# Patient Record
Sex: Male | Born: 1955 | Race: White | Hispanic: No | Marital: Married | Smoking: Never smoker
Health system: Southern US, Community
[De-identification: ages and names within clinical notes are randomized; demographics above are authoritative.]

## PROBLEM LIST (undated history)

## (undated) DIAGNOSIS — I10 Essential (primary) hypertension: Secondary | ICD-10-CM

## (undated) DIAGNOSIS — N200 Calculus of kidney: Secondary | ICD-10-CM

## (undated) DIAGNOSIS — N289 Disorder of kidney and ureter, unspecified: Secondary | ICD-10-CM

## (undated) HISTORY — DX: Calculus of kidney: N20.0

---

## 2002-10-10 ENCOUNTER — Emergency Department (HOSPITAL_COMMUNITY): Admission: EM | Admit: 2002-10-10 | Discharge: 2002-10-10 | Payer: Self-pay | Admitting: Emergency Medicine

## 2005-03-08 ENCOUNTER — Emergency Department (HOSPITAL_COMMUNITY): Admission: EM | Admit: 2005-03-08 | Discharge: 2005-03-08 | Payer: Self-pay | Admitting: Emergency Medicine

## 2005-05-02 ENCOUNTER — Emergency Department (HOSPITAL_COMMUNITY): Admission: EM | Admit: 2005-05-02 | Discharge: 2005-05-02 | Payer: Self-pay | Admitting: Emergency Medicine

## 2005-05-03 ENCOUNTER — Inpatient Hospital Stay (HOSPITAL_COMMUNITY): Admission: EM | Admit: 2005-05-03 | Discharge: 2005-05-06 | Payer: Self-pay | Admitting: Emergency Medicine

## 2005-05-20 ENCOUNTER — Ambulatory Visit (HOSPITAL_COMMUNITY): Admission: RE | Admit: 2005-05-20 | Discharge: 2005-05-20 | Payer: Self-pay | Admitting: Urology

## 2006-06-23 ENCOUNTER — Inpatient Hospital Stay (HOSPITAL_COMMUNITY): Admission: EM | Admit: 2006-06-23 | Discharge: 2006-06-24 | Payer: Self-pay | Admitting: Emergency Medicine

## 2006-07-01 ENCOUNTER — Inpatient Hospital Stay (HOSPITAL_COMMUNITY): Admission: EM | Admit: 2006-07-01 | Discharge: 2006-07-05 | Payer: Self-pay | Admitting: Emergency Medicine

## 2006-07-03 ENCOUNTER — Ambulatory Visit: Payer: Self-pay | Admitting: Internal Medicine

## 2009-04-13 ENCOUNTER — Inpatient Hospital Stay (HOSPITAL_COMMUNITY): Admission: EM | Admit: 2009-04-13 | Discharge: 2009-04-15 | Payer: Self-pay | Admitting: Emergency Medicine

## 2010-05-27 LAB — CBC
HCT: 44.7 % (ref 39.0–52.0)
HCT: 49.5 % (ref 39.0–52.0)
Hemoglobin: 16.6 g/dL (ref 13.0–17.0)
MCHC: 33.5 g/dL (ref 30.0–36.0)
MCHC: 33.6 g/dL (ref 30.0–36.0)
Platelets: 144 10*3/uL — ABNORMAL LOW (ref 150–400)
RDW: 13.5 % (ref 11.5–15.5)
RDW: 13.5 % (ref 11.5–15.5)
WBC: 3.9 10*3/uL — ABNORMAL LOW (ref 4.0–10.5)

## 2010-05-27 LAB — COMPREHENSIVE METABOLIC PANEL
ALT: 72 U/L — ABNORMAL HIGH (ref 0–53)
AST: 73 U/L — ABNORMAL HIGH (ref 0–37)
Alkaline Phosphatase: 42 U/L (ref 39–117)
BUN: 10 mg/dL (ref 6–23)
Calcium: 8 mg/dL — ABNORMAL LOW (ref 8.4–10.5)
Creatinine, Ser: 1.15 mg/dL (ref 0.4–1.5)
Creatinine, Ser: 1.4 mg/dL (ref 0.4–1.5)
GFR calc Af Amer: >60 mL/min (ref >60)
Glucose, Bld: 101 mg/dL — ABNORMAL HIGH (ref 70–99)
Glucose, Bld: 86 mg/dL (ref 70–99)
Sodium: 132 mEq/L — ABNORMAL LOW (ref 135–145)
Sodium: 137 mEq/L (ref 135–145)
Total Protein: 6.2 g/dL (ref 6.0–8.3)
Total Protein: 7.8 g/dL (ref 6.0–8.3)

## 2010-05-27 LAB — BLOOD GAS, ARTERIAL
Patient temperature: 37
TCO2: 21.5 mmol/L (ref 0–100)
pH, Arterial: 7.364 (ref 7.350–7.450)

## 2010-05-27 LAB — DIFFERENTIAL
Basophils Absolute: 0 10*3/uL (ref 0.0–0.1)
Basophils Relative: 0 % (ref 0–1)
Eosinophils Absolute: 0 10*3/uL (ref 0.0–0.7)
Lymphocytes Relative: 36 % (ref 12–46)
Lymphs Abs: 1 10*3/uL (ref 0.7–4.0)
Monocytes Absolute: 0.6 10*3/uL (ref 0.1–1.0)
Monocytes Relative: 14 % — ABNORMAL HIGH (ref 3–12)
Monocytes Relative: 15 % — ABNORMAL HIGH (ref 3–12)
Neutro Abs: 1.3 10*3/uL — ABNORMAL LOW (ref 1.7–7.7)
Neutro Abs: 2.9 10*3/uL (ref 1.7–7.7)
Neutrophils Relative %: 49 % (ref 43–77)

## 2010-05-27 LAB — CULTURE, BLOOD (ROUTINE X 2)
Culture: NO GROWTH
Report Status: 2112011

## 2010-05-27 LAB — URINALYSIS, ROUTINE W REFLEX MICROSCOPIC: Ketones, ur: 15 mg/dL — AB

## 2010-05-27 LAB — URINE CULTURE

## 2010-05-27 LAB — LACTIC ACID, PLASMA: Lactic Acid, Venous: 1.5 mmol/L (ref 0.5–2.2)

## 2010-07-18 ENCOUNTER — Emergency Department (HOSPITAL_COMMUNITY)
Admission: EM | Admit: 2010-07-18 | Discharge: 2010-07-18 | Disposition: A | Discharge status: Home / Self Care | Payer: Self-pay | Attending: Emergency Medicine | Admitting: Emergency Medicine

## 2010-07-18 DIAGNOSIS — I10 Essential (primary) hypertension: Secondary | ICD-10-CM | POA: Insufficient documentation

## 2010-07-18 DIAGNOSIS — L03019 Cellulitis of unspecified finger: Secondary | ICD-10-CM | POA: Insufficient documentation

## 2010-07-24 NOTE — Consult Note (Signed)
NAME:  [name], [name]                ACCOUNT NO.:  [id number]   MEDICAL RECORD NO.:  [id number]          PATIENT TYPE:  INP   LOCATION:  A331                          FACILITY:  [facility]   PHYSICIAN:  R. Roetta Sessions, M.D. DATE OF BIRTH:  April 12, 1955   DATE OF CONSULTATION:  07/03/2006  DATE OF DISCHARGE:                                 CONSULTATION   REASON FOR CONSULTATION:  Fever, colitis on CT.   HISTORY OF PRESENT ILLNESS:  The patient is a very pleasant 55 year old  Caucasian male, a resident of [name], admitted to the hospital July 01, 2006, with flank pain, abdominal pain, and fever.  He has a history  of left nephrolithiasis with ureteral obstruction.  He was seen by Dr.  Rito Ehrlich a week ago and underwent a left ureteral stent.  On April 22 he  to went to [facility] and had shock wave lithotripsy.  Plans for  stent removal were being made for tomorrow, although the patient's flank  pain has improved, he started having crampy abdominal pain and fever  over the past 36 hours and was brought to the hospital and subsequently  admitted.  He had temperature spikes to 102.  He has been on  ciprofloxacin for the past 5 days.  He was admitted to Dr. Thornton Park service yesterday and he was started on Flagyl yesterday by  Dr. Regino Schultze, his primary care physician.  He has spiked over the past 24  hours as high as 100.7.  He also tells me he has had bilateral lower  abdominal cramps, right side worse than left side, and has had non  bloody watery diarrhea for the past 36 hours.  He has not had any  hematochezia, melena, has not had any hematemesis.  Denies chronic  gastrointestinal symptoms such as odynophagia, dysphagia, early satiety,  reflux symptoms, weight loss.   The patient had a CT of the abdomen and pelvis yesterday which revealed  some thickening and fat stranding in the area of the right colon.  The  appendix appeared normal.  The patient had a left kidney  hematoma and  residual nephrolithiasis present.  I personally reviewed the films with  Dr. Deanne Coffer.   It is notable today that he has a white count of 15.300, it was 12,800  on April 25.  Today's hemoglobin and hematocrit is 14.4 and 43.4.  MCV  92.2, platelet count 235,000.  Urinalysis revealed positive ketones, a  large amount of blood, moderate leukocytes.   PAST MEDICAL HISTORY:  Past medical history is significant for recurrent  urolithiasis.  He had cystoscopy with left retrograde pyelogram and  stent placement previously.  ESL of left renal calculus as described  above.  History of kidney colic secondary to stones one year ago for  which he was hospitalized.   MEDICATIONS ON ADMISSION:  Norvasc, ciprofloxacin and Percocet.   ALLERGIES:  No known drug allergies.   FAMILY HISTORY:  Positive for kidney stones.  There is no family history  of gastrointestinal malignancy or other chronic gastrointestinal  illness.   SOCIAL HISTORY:  The patient is married.  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  He does not use tobacco or alcohol.  He has  no children.   REVIEW OF SYSTEMS:  GI:  As in history of present illness.  He has not  had any recent weight loss.  No chest pain.  No dyspnea on exertion.   PHYSICAL EXAMINATION:  GENERAL:  This is a 55 year old gentleman resting  comfortably.  VITAL SIGNS:  Temperature 98.3, pulse 76, respiratory rate 16, blood  pressure 126/69.  SKIN:  Warm and dry.  There is no jaundice.  HEENT:  No scleral icterus.  Conjunctivae are pink.  Oral cavity:  No  lesions.  CHEST:  Lungs are clear to auscultation.  HEART:  Regular rate and rhythm without murmur, gallop, rub.  ABDOMEN:  Nondistended, positive bowel sounds.  He has diffuse abdominal  tenderness to deep palpation more so on the right than the left.  No  appreciable mass or organomegaly.  EXTREMITIES:  Extremities have no edema.   IMPRESSION:  Mr. Thedford Bunton is a very  pleasant 55 year old  gentleman with a recent history of complicated nephrolithiasis with left  ureteral obstruction requiring ureteral stenting and lithotripsy, who  now presents with crampy abdominal pain, fever and watery nonbloody  diarrhea in association with recent oral antibiotic therapy.   I suspect his illness including CT findings are related to antibiotic-  associated colitis/Clostridium difficile overgrowth.   This would be the most likely culprit.   RECOMMENDATIONS:  1. Agree with adding Flagyl to his regimen.  2. Will add a probiotic in the way of Flora-Q one capsule daily.  3. Send stool for Clostridium difficile toxin assay.  4. Would recommend once Mr. Buskey is over his acute illness, he go      ahead and be set up for a screening colonoscopy in the outpatient      setting as he is at the threshold age.   I have discussed my findings and recommendations with Dr. Karleen Hampshire and the nurses station.   I would like to thank Dr. Regino Schultze and Dr. Rito Ehrlich for allowing me to  see this nice gentleman today.      Jonathon Bellows, M.D.  Electronically Signed     RMR/MEDQ  D:  07/03/2006  T:  07/03/2006  Job:  [id number]

## 2010-07-24 NOTE — H&P (Signed)
NAMEMELITON, SAMAD                ACCOUNT NO.:  [id number]   MEDICAL RECORD NO.:  [id number]          PATIENT TYPE:  INP   LOCATION:  A320                          FACILITY:  [facility]   PHYSICIAN:  Dennie Maizes, M.D.   DATE OF BIRTH:  1955-04-16   DATE OF ADMISSION:  06/23/2006  DATE OF DISCHARGE:  LH                              HISTORY & PHYSICAL   CHIEF COMPLAINT:  Severe left flank pain radiating to the front, nausea.   HISTORY OF PRESENT ILLNESS:  This 55 year old male has a past history of  recurrent urolithiasis.  Has had kidney stone disease for about 10  years.  He passed a stone 10 years ago.  He was treated for another  stone in 04/2005 by Dr. Jerre Simon.   He complains of sudden onset of severe left flank pain radiating to the  front since yesterday.  This was associated intermittent nausea.  The  patient denied having any fever, chills, voiding difficulty or gross  hematuria.   PAST MEDICAL HISTORY:  History of hypertension.   MEDICATION:  Norvasc 10 mg one p.o. daily.   ALLERGIES:  None.   CURRENT FAMILY HISTORY:  Positive for kidney stones.   REVIEW OF SYSTEMS:  Unremarkable.   PHYSICAL EXAMINATION:  GENERAL:  The patient is comfortable without  receiving parenteral narcotics.  HEENT:  Normal.  NECK:  No masses.  LUNGS:  Clear to auscultation.  HEART:  Regular rate and rhythm.  No murmurs.  ABDOMEN:  Soft.  No palpable flank mass.  Mild left costovertebral angle  tenderness is noted.  Bladder not palpable.  Penis and testes are  normal.   ADMISSION LABORATORIES:  CBC:  WBC 11.3, hemoglobin 15.7, hematocrit  46.1.  B met:  Sodium 137, potassium 3.2, chloride 100, CO2 25, glucose  122, BUN 12, creatinine 1.47.   A noncontrast CT scan of abdomen and pelvis was done.  This revealed a 6  x 5 mm size left renal calculus at the ureteropelvic junction with  obstruction.  X-ray of the KUB area confirmed the presence of the renal  pelvic stone.   IMPRESSION:  1.  Left renal stone at ureteropelvic junction.  2. Left renal colic.  3. Left hydronephrosis.   PLAN:  I discussed with the patient regarding the management options.  He has decided to undergo cystoscopy, left retrograde pyelogram and left  ureteral stent placement under anesthesia in a.m..  I have informed the  patient regarding the management options.  I explained to him regarding  the diagnosis, operative details of the treatment, outcome, possible  risks and complications.  He has agreed for the procedure to be done.   Obstructing stone will later be treated with ESL as an outpatient.      Dennie Maizes, M.D.  Electronically Signed     SK/MEDQ  D:  06/23/2006  T:  06/24/2006  Job:  [id number]   cc:   Kirk Ruths, M.D.  Fax: [phone]

## 2010-07-24 NOTE — H&P (Signed)
[name], [name]                ACCOUNT NO.:  [id number]   MEDICAL RECORD NO.:  [id number]          PATIENT TYPE:  INP   LOCATION:  A331                          FACILITY:  [facility]   PHYSICIAN:  Dennie Maizes, M.D.   DATE OF BIRTH:  10/23/1955   DATE OF ADMISSION:  07/01/2006  DATE OF DISCHARGE:  LH                              HISTORY & PHYSICAL   CHIEF COMPLAINT:  Fever up to 102 degrees Fahrenheit, lower abdominal  pain, nausea.   HISTORY OF PRESENT ILLNESS:  This 55 year old male has a past history of  recurrent urolithiasis.  The patient has had recurrent stones for about  10 years.  He was admitted to the hospital about 2 weeks ago with severe  left flank pain, radiating to the front.  X-rays revealed a 5 mm left  upper ureteral calculus with obstruction and hydronephrosis.  Cystoscopy  and left ureteral stent placement were done.  The stone was pushed into  the renal pelvis.  ESL of the left renal calculus was done at  [facility] on Tuesday.  The patient complains of having fever  up to 102 degrees Fahrenheit with chills.  He also complains of lower  abdominal pain and nausea.  He came to the emergency room for further  evaluation.  He was seen by Dr. Colon Branch and he has been admitted to the  hospital for further evaluation and management.   The patient denied having any voiding difficulty, dysuria or gross  hematuria at present.   PAST MEDICAL HISTORY:  History of hypertension, recurrent urolithiasis,  status post cystoscopy, left retrograde pyelogram, left ureteral stent  placement, ESL of left renal calculus.   MEDICATIONS:  1. Norvasc 10 mg one p.o. daily.  2. Cipro 5 grams p.o. twice a day  3. Percocet 5/327 p.o. q. 8 hours p.r.n. pain #20.   ALLERGIES:  None.   FAMILY HISTORY:  Positive for kidney stones.   EXAMINATION:  The patient is comfortable at the time of examination.  HEAD, EYES, EARS, NOSE AND THROAT: Normal.  NECK: No masses.  LUNGS:  Clear to auscultation.  HEART: Regular rate and rhythm.  No murmurs.  ABDOMEN:  Soft.  No palpable flank mass or costovertebral angle  tenderness.  Bladder is not palpable.  PENIS AND TESTES:  Normal.   ADMISSION LABS:  Urinalysis:  Blood large, leukocyte esterase moderate,  RBCs too numerous to count.  Sodium 133, potassium 3.6, BUN 8,  creatinine 1.36.  CBC:  WBC 12.8, hemoglobin 15.6, hematocrit 44.9.  Urine culture and sensitivity, blood culture and sensitivity have been  done and results are pending.  The patient has been started on IV Cipro.   Chest X-Ray: Revealed no evidence of any acute cardiopulmonary disease.   IMPRESSION:  Urinary tract infection, acute pyelonephritis, status post  ESL of left renal calculus, status post left ureteral stent placement.   PLAN:  1. Admit the patient to the hospital.  2. Intravenous fluids.  3. Intravenous Cipro.  4. Blood culture and sensitivity and urine culture and sensitivity.  5. X-ray of the KUB area.  6. Medical consult, Dr. Regino Schultze.  I discussed with the patient      regarding the diagnosis and further evaluation.      Dennie Maizes, M.D.  Electronically Signed     SK/MEDQ  D:  07/02/2006  T:  07/02/2006  Job:  [id number]   cc:   Kirk Ruths, M.D.  Fax: [phone]

## 2010-07-24 NOTE — Op Note (Signed)
NAMEGIULIAN, [name]                ACCOUNT NO.:  [id number]   MEDICAL RECORD NO.:  [id number]          PATIENT TYPE:  INP   LOCATION:  A312                          FACILITY:  [facility]   PHYSICIAN:  Ky Barban, M.D.DATE OF BIRTH:  03/03/1956   DATE OF PROCEDURE:  05/06/2005  DATE OF DISCHARGE:  05/06/2005                                 OPERATIVE REPORT   PREOPERATIVE DIAGNOSIS:  Left ureteropelvic junction stone.   POSTOPERATIVE DIAGNOSIS:  Left upper ureteral calculus.   PROCEDURE:  Cystoscopy, left retrograde pyelogram, insertion of double J  stent, left side, size 6-French 25-cm.   ANESTHESIA:  General endotracheal.   PROCEDURE:  The patient was given general endotracheal anesthesia, placed in  lithotomy position. After usual prep and drape, #25 cystoscope was  introduced into the bladder. The bladder was inspected and looks normal. The  left ureteral orifice catheterized with a wedge catheter, and Hypaque was  injected under fluoroscopic control. The dye goes into the ureter, looks  normal. In the upper ureter about an inch below the UPJ, I can see there is  a filling defect the size of the stone seen on CT scan. It looks like the  stone has moved into the ureter from the UPJ. The renal pelvis and the  calyces are dilated. I placed an open-ended catheter into the upper ureter,  and Hypaque was again injected to see the UPJ a little better. I do not see  any stone in the UPJ. The stone is in the upper ureter about an inch or so  below the UPJ. Now, a guidewire is passed up into the renal pelvis, and over  the guidewire, an open-ended catheter #5 is pushed into the renal pelvis.  Hydronephrotic drip is obtained. Hypaque is injected to confirm the position  of the catheter, and then the guidewire was reintroduced into the renal  pelvis. The guidewire curls up into the renal pelvis over the guidewire. A 6-  French 25-cm double J stent under fluoroscopic control is positioned  between  the renal pelvis and the bladder. A nice loop is obtained in the renal  pelvis and the bladder. All of the instruments and guidewires are removed.  The patient left the operating room in satisfactory condition. It should be  mentioned that I have removed the string from the double J stent.      Ky Barban, M.D.  Electronically Signed     MIJ/MEDQ  D:  05/06/2005  T:  05/07/2005  Job:  [id number]

## 2010-07-24 NOTE — H&P (Signed)
[name], [name]                ACCOUNT NO.:  [id number]   MEDICAL RECORD NO.:  [id number]          PATIENT TYPE:  INP   LOCATION:  A312                          FACILITY:  [facility]   PHYSICIAN:  Ky Barban, M.D.DATE OF BIRTH:  June 09, 1955   DATE OF ADMISSION:  05/03/2005  DATE OF DISCHARGE:  LH                                HISTORY & PHYSICAL   CHIEF COMPLAINT:  Recurrent right flank pain.   HISTORY:  A 55 year old gentleman complains of having pain in his right  flank associated with nausea and vomiting. He said last week he was passing  some blood in his urine. He had right renal colic. Sunday, the pain became  worse, so he came to the emergency room where a CT scan showed there was a 5-  mm stone in the right renal pelvis in the ureteral pelvic junction. No  fevers or chills.   PAST MEDICAL HISTORY:  He passed a kidney stone 10 years ago. He does have  hypertension but does not take any medicine. No diabetes.   FAMILY HISTORY:  Grandmother had kidney stones.   PERSONAL HISTORY:  Does not smoke or drink.   REVIEW OF SYSTEMS:  Unremarkable.   PHYSICAL EXAMINATION:  GENERAL:  Moderately built, not in acute distress.  VITAL SIGNS:  Blood pressure 140/90, temperature normal.  CENTRAL NERVOUS SYSTEM:  No gross neurological deficit.  HEENT:  Negative.  CHEST:  Symmetrical.  HEART:  Regular sinus rhythm.  ABDOMEN:  Soft, flat. Liver, spleen and kidneys are not palpable.  EXTERNAL GENITALIA:  Unremarkable.  RECTAL:  Deferred.  EXTREMITIES:  Normal.   IMPRESSION:  Right renal calculus.   PLAN:  IV fluids. IV pain medications. Get a KUB.      Ky Barban, M.D.  Electronically Signed     MIJ/MEDQ  D:  05/03/2005  T:  05/04/2005  Job:  [id number]

## 2010-07-24 NOTE — Discharge Summary (Signed)
NAMERON, BESKE                ACCOUNT NO.:  [id number]   MEDICAL RECORD NO.:  [id number]          PATIENT TYPE:  INP   LOCATION:  A312                          FACILITY:  [facility]   PHYSICIAN:  Ky Barban, M.D.DATE OF BIRTH:  10/11/1955   DATE OF ADMISSION:  05/03/2005  DATE OF DISCHARGE:  03/01/2007LH                                 DISCHARGE SUMMARY   A 55 year old gentleman was admitted with left renal colic.  CT scan showed  there is a 5-mm stone in the left renal pelvis, ureteropelvic junction  causing obstruction.  He was having several days history of this pain and  came to the emergency room.  Because of severe pain, he was admitted for  further management and workup.  IV fluids were started.   LABORATORY:  WBC count is 10.8, hematocrit is 44.8.  Sodium 135, potassium  3.9, chloride 103, CO2 is 25, BUN is 11, creatinine 1.5, calcium 8.8.   And, as I said he was started on parenteral analgesia and IV fluids.  He  continued to complain of pain, so I decided that we go and put a stent and  maybe later on treat him with ESL.  So, he was taken to the operating room.  Cystoscopy __________  pyelogram insertion of double-J stent was done.  Postoperatively the same evening, he was doing fine, so he was discharged  home.   FINAL DISCHARGE DIAGNOSIS:  Left renal calculus.   DISCHARGE CONDITION:  Improved.   DISCHARGE MEDICATIONS:  Percocet 7.5 mg one q.6h. p.r.n. #30.   We will see him back in the office in two weeks.      Ky Barban, M.D.  Electronically Signed     MIJ/MEDQ  D:  06/11/2005  T:  06/12/2005  Job:  [id number]

## 2010-07-24 NOTE — Discharge Summary (Signed)
NAMEJOAHAN, SWATZELL                ACCOUNT NO.:  [id number]   MEDICAL RECORD NO.:  [id number]          PATIENT TYPE:  INP   LOCATION:  A331                          FACILITY:  [facility]   PHYSICIAN:  Dennie Maizes, M.D.   DATE OF BIRTH:  May 03, 1955   DATE OF ADMISSION:  07/01/2006  DATE OF DISCHARGE:  04/29/2008LH                               DISCHARGE SUMMARY   CONSULTING PHYSICIANS:  1. Dr. Regino Schultze  2. Dr. Jena Gauss.   FINAL DIAGNOSIS:  Antibiotic-associated colitis, possibly due to  Clostridium difficile.   OTHER DIAGNOSES:  1. Post extracorporeal shock wave lithotripsy, left renal calculus.  2. Small left subcapsular renal hematoma.  3. Hypertension.   OPERATIVE PROCEDURE:  None.   COMPLICATIONS:  None.   DISCHARGE SUMMARY:  This 55 year old male has a past history of  recurrent urolithiasis.  He has had recurrent stones for about 10 years.  He was admitted to hospital in April2008 with severe left flank pain  radiating to the front.  X-rays revealed a 5-mm-size left upper ureteral  calculus with obstruction and hydronephrosis.  Cystoscopy and left  ureteral stent placement were done.  The stone was pushed into the renal  pelvis.  ESL of the left renal calculus was done at [facility].  The patient had fever up to 102 degrees Fahrenheit associated  with chills.  He also complained of lower abdominal pain and nausea.  He  was admitted to hospital for further evaluation.  He denied having any  voiding difficulty, dysuria or gross hematuria.   PAST MEDICAL HISTORY:  1. History of hypertension.  2. Recurrent urolithiasis, status post cystoscopy, left retrograde      pyelogram, left ureteral stent placement and status post ESL of      left renal calculus.   MEDICATION:  1. Norvasc 10 mg one p.o. daily.  2. Cipro 500 mg p.o. b.i.d.  3. Percocet 5/325 one p.o. q.8 h. p.r.n. pain.   ALLERGIES:  None.   FAMILY HISTORY:  Positive for kidney stones.   PHYSICAL  EXAMINATION:  GENERAL:  Examination revealed the patient to be  comfortable at the time of examination.  HEENT:  Normal.  NECK:  No masses.  LUNGS:  Clear to auscultation.  HEART:  Regular rate and rhythm.  No murmurs.  ABDOMEN:  Soft.  No palpable flank mass or costovertebral angle  tenderness.  Bladder is not palpable.  GU:  Penis and testes were normal.   ADMISSION LABORATORY DATA:  Urinalysis:  Blood large, leukocyte esterase  moderate, rbc's too numerous to count.  Sodium 133, potassium 3.6, BUN  8, creatinine 1.36.  CBC:  WBC 12.8, hemoglobin 15.6, hematocrit 44.9.  Urine culture and sensitivity and blood culture and sensitivity were  done and the patient was started on IV Cipro.   Chest x-ray done revealed no evidence of any acute cardiopulmonary  disease.   ADMITTING DIAGNOSES:  1. Urinary tract infection.  2. Possible acute pyelonephritis.  3. Post extracorporeal shock wave lithotripsy of left renal calculus.   COURSE IN THE HOSPITAL:  The patient was started on  IV fluids and  intravenous Cipro.  Blood culture and sensitivity and urine culture and  sensitivity were done.  X-ray of the KUB area was done, which revealed a  fragmentation of the left renal calculus.  As the patient's source of  fever was not clear, Dr. Regino Schultze was consulted for further evaluation.  A noncontrast CT scan of abdomen and pelvis was done; this revealed a  small subcapsular left renal hematoma.  The patient had evidence of  colitis.  As he was on antibiotic therapy, it was thought the colitis  might be antibiotic-related.  Dr. Jena Gauss was consulted.  He felt the  patient's abdominal pain and symptoms were due to colitis associated  with antibiotic therapy.  Urine culture and sensitivity and blood  culture and sensitivity were negative.  The Cipro was stopped and the  patient was started on Flagyl.  C. difficile toxin, however, was  negative.  The patient was voiding well and he had relief of flank  pain  and abdominal pain.  He was discharged and sent home on July 05, 2006.  He was given p.o. Flagyl at the time of discharge.   DISCHARGE MEDICATIONS:  1. He was advised to continue the Norvasc and Valium at the regular      dose.  2. He was given Flagyl 500 mg three times a day.   FOLLOWUP:  He will be reviewed in the office, at which time cystoscopy  and ureteral stent removal will be done.   CONDITION ON DISCHARGE:  The condition of the patient at the time of  discharge was stable.      Dennie Maizes, M.D.  Electronically Signed     SK/MEDQ  D:  08/03/2006  T:  08/03/2006  Job:  [id number]   cc:   Kirk Ruths, M.D.  Fax: [phone]   R. Roetta Sessions, M.D.  [address]  [place]  [place] [id number]

## 2010-07-24 NOTE — Discharge Summary (Signed)
NAMEGARNELL, PHENIX                ACCOUNT NO.:  [id number]   MEDICAL RECORD NO.:  [id number]          PATIENT TYPE:  INP   LOCATION:  A320                          FACILITY:  [facility]   PHYSICIAN:  Dennie Maizes, M.D.   DATE OF BIRTH:  January 31, 1956   DATE OF ADMISSION:  06/23/2006  DATE OF DISCHARGE:  04/18/2008LH                               DISCHARGE SUMMARY   FINAL DIAGNOSES:  Left renal calculus at the ureteropelvic junction,  left renal colic, left hydronephrosis.   OTHER DIAGNOSES:  Hypertension.   OPERATIVE PROCEDURE:  Cystoscopy, left retrograde pyelogram and left  ureteral stent placement done on June 24, 2006.   COMPLICATIONS:  None.   DISCHARGE SUMMARY:  This 55 year old male has a past history of  recurrent urolithiasis.  He has had several kidney stones for about 10  years. He passed a stone about 10 years ago.  He was treated for another  stone in 2007 by Dr. Jerre Simon.   He had sudden onset of severe left flank pain radiating to the front  since June 22, 2006.  This was associated with nausea.  He denied  having any fever, chills, voiding difficulty or gross hematuria.   He came to the emergency room at [facility] for further  evaluation.  A noncontrast CT scan of the abdomen and pelvis revealed a  6.5-mm size left renal calculus located in the ureteropelvic junction  without obstruction.  X-ray of the KUB area confirmed the presence of  the renal pelvic stone.  The patient was admitted to the hospital for IV  fluids, pain control and further treatment.   PAST MEDICAL HISTORY:  History of hypertension.   MEDICATIONS:  Norvasc 10 mg 1 p.o. daily.   ALLERGIES:  None.   FAMILY HISTORY:  Positive for kidney stones.   REVIEW OF SYSTEMS:  Unremarkable.   PHYSICAL EXAMINATION:  GENERAL:  Revealed the patient to be comfortable  after receiving narcotics.  HEENT:  Normal.  NECK:  No masses.  LUNGS:  Clear to auscultation.  HEART:  Regular rate and  rhythm.  No murmurs.  ABDOMEN:  Soft, no palpable flank mass, mild left costovertebral angle  tenderness was noted. Bladder not palpable.  GU:  Penis and testes are normal.   ADMISSION LABS:  CBC, WBC 11.3, hemoglobin 15.7, hematocrit 43.1. Sodium  137, potassium 3.2, chloride 100, CO2 25, glucose 122, BUN 12,  creatinine 1.47.   COURSE IN THE HOSPITAL:  The patient was treated with IV fluids and  potassium supplements.  He continued to have severe intermittent left  flank pain.  After counseling, he was taken to the operating room on  June 24, 2006. Cystoscopy, left retrograde pyelogram and left ureteral  stent placement were done.  The patient had relief of left flank pain  after the stent placement.  He was discharged to home on June 24, 2006.  He was given Percocet 5/325 one p.o. q.8 h p.r.n. pain #20 and Cipro 500  mg p.o. b.i.d. for 7 days. He will be reviewed in the office and ESL of  the left  renal calculus will be done as an outpatient.  The condition of  the patient at the time of discharge was stable.  He was advised to call  me for fever, chills, voiding difficulty or severe pain.      Dennie Maizes, M.D.  Electronically Signed     SK/MEDQ  D:  07/29/2006  T:  07/29/2006  Job:  [id number]   cc:   Kirk Ruths, M.D.  Fax: [phone]

## 2010-07-24 NOTE — Op Note (Signed)
[name], [name]                ACCOUNT NO.:  [id number]   MEDICAL RECORD NO.:  [id number]          PATIENT TYPE:  INP   LOCATION:  A320                          FACILITY:  [facility]   PHYSICIAN:  Dennie Maizes, M.D.   DATE OF BIRTH:  02-20-1956   DATE OF PROCEDURE:  06/24/2006  DATE OF DISCHARGE:                               OPERATIVE REPORT   PREOPERATIVE DIAGNOSIS:  Left renal calculus with obstruction, left  renal colic, left hydronephrosis.   POSTOPERATIVE DIAGNOSIS:  Left renal calculus with obstruction, left  renal colic, left hydronephrosis.   OPERATIVE PROCEDURE:  Cystoscopy, left retrograde pyelogram, left  ureteral stent placement.   ANESTHESIA:  General.   SURGEON:  Dennie Maizes, M.D.   COMPLICATIONS:  None.   DRAINS:  6-French 26-cm size left ureteral stent.   ESTIMATED BLOOD LOSS:  Minimal.   SPECIMEN:  None.   INDICATIONS FOR PROCEDURE:  This 55 year old male has a history of  recurrent urolithiasis.  He was admitted to the hospital with severe  left flank pain.  Evaluation revealed a 6-mm size left renal calculus  impacting the ureteropelvic junction with obstruction and  hydronephrosis.  The patient is taken to the OR today for cystoscopy,  left retrograde pyelogram, and left ureteral stent placement.   DESCRIPTION OF PROCEDURE:  General anesthesia was induced, and the  patient was placed on the OR table in the dorsal lithotomy position.  The lower abdomen and genitalia were prepped and draped in a sterile  fashion.  The patient had large external hemorrhoids.   Cystoscopy was done with a 22-French scope.  The urethra and prostate  were normal.  The appearance of the bladder was unremarkable.  A 5-  French wedge catheter was then placed in the left ureteral orifice.  About 7 mL of Renografin-60 was injected into the collecting system, and  retrograde pyelogram was done by using C-arm fluoroscopy.  The entire  ureter was normal.  There was a stone  impacted at the ureteropelvic  junction with mild hydronephrosis.   A 5-French open-ended catheter was then placed in the left distal  ureter.  A 0.038-inch Bentson guidewire was then inserted into the left  collecting system without any difficulty.  The open-ended catheter was  removed.  A 6-French  26-cm size ureteral stent was then inserted over the guidewire and  placed in the left collecting system.  The instruments were removed.  The patient was transferred to the PACU in a satisfactory condition.   I plan to see the obstructing stone with ESL as an outpatient later.      Dennie Maizes, M.D.  Electronically Signed     SK/MEDQ  D:  06/24/2006  T:  06/24/2006  Job:  [id number]   cc:   Kirk Ruths, M.D.  Fax: [phone]

## 2012-09-02 ENCOUNTER — Encounter (HOSPITAL_COMMUNITY): Payer: Self-pay | Admitting: *Deleted

## 2012-09-02 ENCOUNTER — Emergency Department (HOSPITAL_COMMUNITY)
Admission: EM | Admit: 2012-09-02 | Discharge: 2012-09-02 | Disposition: A | Discharge status: Home / Self Care | Payer: Self-pay | Attending: Emergency Medicine | Admitting: Emergency Medicine

## 2012-09-02 DIAGNOSIS — H9319 Tinnitus, unspecified ear: Secondary | ICD-10-CM | POA: Insufficient documentation

## 2012-09-02 DIAGNOSIS — I10 Essential (primary) hypertension: Secondary | ICD-10-CM | POA: Insufficient documentation

## 2012-09-02 DIAGNOSIS — R42 Dizziness and giddiness: Secondary | ICD-10-CM | POA: Insufficient documentation

## 2012-09-02 DIAGNOSIS — R112 Nausea with vomiting, unspecified: Secondary | ICD-10-CM | POA: Insufficient documentation

## 2012-09-02 DIAGNOSIS — H9201 Otalgia, right ear: Secondary | ICD-10-CM

## 2012-09-02 DIAGNOSIS — H938X9 Other specified disorders of ear, unspecified ear: Secondary | ICD-10-CM | POA: Insufficient documentation

## 2012-09-02 HISTORY — DX: Essential (primary) hypertension: I10

## 2012-09-02 MED ORDER — MECLIZINE HCL 12.5 MG PO TABS
25.0000 mg | ORAL_TABLET | Freq: Once | ORAL | Status: AC
  Administered 2012-09-02: 25 mg via ORAL
  Filled 2012-09-02: qty 2

## 2012-09-02 MED ORDER — LORATADINE 10 MG PO TABS
10.0000 mg | ORAL_TABLET | Freq: Once | ORAL | Status: AC
  Administered 2012-09-02: 10 mg via ORAL
  Filled 2012-09-02: qty 1

## 2012-09-02 MED ORDER — ONDANSETRON 4 MG PO TBDP: 4.0000 mg | ORAL_TABLET | Freq: Three times a day (TID) | ORAL | Status: DC | PRN

## 2012-09-02 MED ORDER — ONDANSETRON 8 MG PO TBDP
8.0000 mg | ORAL_TABLET | Freq: Once | ORAL | Status: AC
  Administered 2012-09-02: 8 mg via ORAL
  Filled 2012-09-02: qty 1

## 2012-09-02 MED ORDER — CETIRIZINE-PSEUDOEPHEDRINE ER 5-120 MG PO TB12: 1.0000 | ORAL_TABLET | Freq: Every day | ORAL | Status: DC

## 2012-09-02 MED ORDER — MECLIZINE HCL 50 MG PO TABS: 50.0000 mg | ORAL_TABLET | Freq: Three times a day (TID) | ORAL | Status: DC | PRN

## 2012-09-02 NOTE — ED Provider Notes (Signed)
History    CSN: [id number] Arrival date & time 09/02/12  0224  First MD Initiated Contact with Patient 09/02/12 0259     Chief Complaint  Patient presents with  . Tinnitus  . Nausea  . Emesis   (Consider location/radiation/quality/duration/timing/severity/associated sxs/prior Treatment) HPI HPI Comments: [name] is a 57 y.o. male who presents to the Emergency Department complaining of muffled sound in his right ear that began while driving and has been muffled since yesterday. Today he went out to eat and became nauseated and vomiting. He had some dizziness and is slightly off balance. Denies fever, chills, shortness of breath, cough.  PCP Dr. Regino Schultze Past Medical History  Diagnosis Date  . Hypertension    History reviewed. No pertinent past surgical history. No family history on file. History  Substance Use Topics  . Smoking status: Never Smoker   . Smokeless tobacco: Not on file  . Alcohol Use: No    Review of Systems  Constitutional: Negative for fever.       10 Systems reviewed and are negative for acute change except as noted in the HPI.  HENT: Negative for congestion.        Ear muffled  Eyes: Negative for discharge and redness.  Respiratory: Negative for cough and shortness of breath.   Cardiovascular: Negative for chest pain.  Gastrointestinal: Negative for vomiting and abdominal pain.  Musculoskeletal: Negative for back pain.  Skin: Negative for rash.  Neurological: Negative for syncope, numbness and headaches.  Psychiatric/Behavioral:       No behavior change.    Allergies  Ciprofloxacin  Home Medications   Current Outpatient Rx  Name  Route  Sig  Dispense  Refill  . cetirizine-pseudoephedrine (ZYRTEC-D) 5-120 MG per tablet   Oral   Take 1 tablet by mouth daily.   30 tablet   0   . meclizine (ANTIVERT) 50 MG tablet   Oral   Take 1 tablet (50 mg total) by mouth 3 (three) times daily as needed.   30 tablet   0   . ondansetron (ZOFRAN  ODT) 4 MG disintegrating tablet   Oral   Take 1 tablet (4 mg total) by mouth every 8 (eight) hours as needed for nausea.   20 tablet   0    BP 173/100  Pulse 63  Temp(Src) 97.8 F (36.6 C)  Resp 20  Ht 5\' 7"  (1.702 m)  Wt 190 lb (86.183 kg)  BMI 29.75 kg/m2  SpO2 96% Physical Exam  Nursing note and vitals reviewed. Constitutional: He is oriented to person, place, and time. He appears well-developed and well-nourished.  Awake, alert, nontoxic appearance.  HENT:  Head: Normocephalic and atraumatic.  Right Ear: External ear normal.  Left Ear: External ear normal.  Mouth/Throat: Oropharynx is clear and moist.  Eyes: EOM are normal. Pupils are equal, round, and reactive to light.  Neck: Normal range of motion. Neck supple.  Cardiovascular: Normal rate and intact distal pulses.   Pulmonary/Chest: Effort normal and breath sounds normal. He exhibits no tenderness.  Abdominal: Soft. Bowel sounds are normal. There is no tenderness. There is no rebound.  Musculoskeletal: He exhibits no tenderness.  Baseline ROM, no obvious new focal weakness.  Neurological: He is alert and oriented to person, place, and time. No cranial nerve deficit. Coordination normal.  Mental status and motor strength appears baseline for patient and situation.  Skin: No rash noted.  Psychiatric: He has a normal mood and affect.    ED  Course  Procedures (including critical care time)  Medications  meclizine (ANTIVERT) tablet 25 mg (25 mg Oral Given 09/02/12 0435)  loratadine (CLARITIN) tablet 10 mg (10 mg Oral Given 09/02/12 0435)  ondansetron (ZOFRAN-ODT) disintegrating tablet 8 mg (8 mg Oral Given 09/02/12 0435)    1. Ear discomfort, right    0259 Patient admission BP was 173/100. He states he used to be on a blood pressure medicine but as his pressure improved he stopped taking it (3 years ago). Recheck of BP 156/95. I advised him to follow up with PCP and be started on medication.  MDM  Patient presents  with right ear muffling and dizziness. Right ear is clear. Suspect middle ear. Have given him claritin, meclizine, zofran and referral to Dr. Suszanne Conners. He is to follow upwith PCP regarding blood pressure. Pt stable in ED with no significant deterioration in condition.The patient appears reasonably screened and/or stabilized for discharge and I doubt any other medical condition or other [facility] requiring further screening, evaluation, or treatment in the ED at this time prior to discharge.  MDM Reviewed: nursing note and vitals     Nicoletta Dress. Colon Branch, MD 09/02/12 [phone]

## 2012-09-02 NOTE — ED Notes (Signed)
Pt reports ringing in right ear. Pt states he has a muffled sound in his right ear since yesterday. Pt states he is off balance and has had episodes of nausea and vomiting.

## 2012-09-28 ENCOUNTER — Other Ambulatory Visit (HOSPITAL_COMMUNITY): Payer: Self-pay | Admitting: Otolaryngology

## 2012-09-28 DIAGNOSIS — H9193 Unspecified hearing loss, bilateral: Secondary | ICD-10-CM

## 2012-09-28 DIAGNOSIS — H8101 Meniere's disease, right ear: Secondary | ICD-10-CM

## 2012-11-17 ENCOUNTER — Ambulatory Visit (HOSPITAL_COMMUNITY): Admission: RE | Admit: 2012-11-17 | Payer: Self-pay | Source: Ambulatory Visit

## 2014-02-04 ENCOUNTER — Encounter (HOSPITAL_COMMUNITY): Payer: Self-pay | Admitting: *Deleted

## 2014-02-04 ENCOUNTER — Emergency Department (HOSPITAL_COMMUNITY)
Admission: EM | Admit: 2014-02-04 | Discharge: 2014-02-04 | Disposition: A | Discharge status: Home / Self Care | Payer: Self-pay | Attending: Emergency Medicine | Admitting: Emergency Medicine

## 2014-02-04 DIAGNOSIS — I10 Essential (primary) hypertension: Secondary | ICD-10-CM | POA: Insufficient documentation

## 2014-02-04 DIAGNOSIS — M545 Low back pain: Secondary | ICD-10-CM

## 2014-02-04 DIAGNOSIS — R350 Frequency of micturition: Secondary | ICD-10-CM | POA: Insufficient documentation

## 2014-02-04 DIAGNOSIS — R358 Other polyuria: Secondary | ICD-10-CM

## 2014-02-04 DIAGNOSIS — Z79899 Other long term (current) drug therapy: Secondary | ICD-10-CM | POA: Insufficient documentation

## 2014-02-04 DIAGNOSIS — Z87442 Personal history of urinary calculi: Secondary | ICD-10-CM | POA: Insufficient documentation

## 2014-02-04 DIAGNOSIS — M5440 Lumbago with sciatica, unspecified side: Secondary | ICD-10-CM | POA: Insufficient documentation

## 2014-02-04 DIAGNOSIS — R3589 Other polyuria: Secondary | ICD-10-CM

## 2014-02-04 HISTORY — DX: Disorder of kidney and ureter, unspecified: N28.9

## 2014-02-04 LAB — I-STAT CHEM 8, ED
BUN: 13 mg/dL (ref 6–23)
CALCIUM ION: 1.18 mmol/L (ref 1.12–1.23)
Chloride: 106 mEq/L (ref 96–112)
Creatinine, Ser: 1.1 mg/dL (ref 0.50–1.35)
Glucose, Bld: 103 mg/dL — ABNORMAL HIGH (ref 70–99)
HEMATOCRIT: 47 % (ref 39.0–52.0)
HEMOGLOBIN: 16 g/dL (ref 13.0–17.0)
Potassium: 3.7 mEq/L (ref 3.7–5.3)
Sodium: 141 mEq/L (ref 137–147)
TCO2: 22 mmol/L (ref 0–100)

## 2014-02-04 LAB — URINALYSIS, ROUTINE W REFLEX MICROSCOPIC
BILIRUBIN URINE: NEGATIVE
GLUCOSE, UA: NEGATIVE mg/dL
Hgb urine dipstick: NEGATIVE
Ketones, ur: NEGATIVE mg/dL
Leukocytes, UA: NEGATIVE
NITRITE: NEGATIVE
Protein, ur: NEGATIVE mg/dL
Specific Gravity, Urine: 1.025 (ref 1.005–1.030)
Urobilinogen, UA: 0.2 mg/dL (ref 0.0–1.0)
pH: 5.5 (ref 5.0–8.0)

## 2014-02-04 MED ORDER — KETOROLAC TROMETHAMINE 30 MG/ML IJ SOLN
30.0000 mg | Freq: Once | INTRAMUSCULAR | Status: AC
  Administered 2014-02-04: 30 mg via INTRAMUSCULAR
  Filled 2014-02-04: qty 1

## 2014-02-04 MED ORDER — IBUPROFEN 800 MG PO TABS: 800.0000 mg | ORAL_TABLET | Freq: Three times a day (TID) | ORAL | Status: AC

## 2014-02-04 NOTE — Discharge Instructions (Signed)
As discussed, your evaluation today has been largely reassuring.  But, it is important that you monitor your condition carefully, and do not hesitate to return to the ED if you develop new, or concerning changes in your condition. ? ?Otherwise, please follow-up with your physician for appropriate ongoing care. ? ?

## 2014-02-04 NOTE — ED Notes (Signed)
Pt left ED, ambulatory with no signs of distress. Pt verbalized discharge instructions. 

## 2014-02-04 NOTE — ED Provider Notes (Signed)
CSN: [id number]     Arrival date & time 02/04/14  8657 History  This chart was scribe for Jonathan Muskrat, MD by Judithann Sauger, ED Scribe. The patient was seen in room APA12/APA12 and the patient's care was started at 8:19 PM.   Chief Complaint  Patient presents with  . Flank Pain   The history is provided by the patient. No language interpreter was used.   HPI Comments: [name] is a 58 y.o. male with a hx of kidney stones who presents to the Emergency Department complaining of a severe gradually worsening constant mid lower back pain onset 3 days ago. He denies any front and side back pain. He reports that the pain is worse when he urinates. He reports nausea but no vomiting. He denies diarrhea. He reports taking aleve with no relief. He denies that these symptoms are similar to his kidney stones. He reports a past hx of prostatitis and adds that these symptoms are similar to when he was diagnosed.    Past Medical History  Diagnosis Date  . Hypertension   . Renal disorder     kidney stones 07-08   History reviewed. No pertinent past surgical history. No family history on file. History  Substance Use Topics  . Smoking status: Never Smoker   . Smokeless tobacco: Not on file  . Alcohol Use: No    Review of Systems  Constitutional: Negative for fever.       Per HPI, otherwise negative  HENT:       Per HPI, otherwise negative  Respiratory:       Per HPI, otherwise negative  Cardiovascular:       Per HPI, otherwise negative  Gastrointestinal: Positive for nausea. Negative for vomiting and diarrhea.  Endocrine:       Negative aside from HPI  Genitourinary:       Neg aside from HPI   Musculoskeletal: Positive for back pain.       Per HPI, otherwise negative  Skin: Negative.   Neurological: Negative for syncope.      Allergies  Ciprofloxacin  Home Medications   Prior to Admission medications   Medication Sig Start Date End Date Taking? Authorizing Provider   cetirizine-pseudoephedrine (ZYRTEC-D) 5-120 MG per tablet Take 1 tablet by mouth daily. 09/02/12   Prentiss Bells, MD  meclizine (ANTIVERT) 50 MG tablet Take 1 tablet (50 mg total) by mouth 3 (three) times daily as needed. 09/02/12   Prentiss Bells, MD  ondansetron (ZOFRAN ODT) 4 MG disintegrating tablet Take 1 tablet (4 mg total) by mouth every 8 (eight) hours as needed for nausea. 09/02/12   Prentiss Bells, MD   BP 186/105 mmHg  Pulse 74  Temp(Src) 97.8 F (36.6 C) (Oral)  Resp 18  Ht 5\' 7"  (1.702 m)  Wt 190 lb (86.183 kg)  BMI 29.75 kg/m2  SpO2 98% Physical Exam  Constitutional: He is oriented to person, place, and time. He appears well-developed. No distress.  HENT:  Head: Normocephalic and atraumatic.  Eyes: Conjunctivae and EOM are normal.  Cardiovascular: Normal rate, regular rhythm and normal heart sounds.   Pulmonary/Chest: Effort normal and breath sounds normal. No stridor. No respiratory distress.  Abdominal: Soft. Bowel sounds are normal. He exhibits no distension. There is no tenderness.  Genitourinary: Rectal exam shows no external hemorrhoid, no internal hemorrhoid, no fissure, no mass, no tenderness and anal tone normal. Prostate is not enlarged and not tender.  Musculoskeletal: He exhibits no edema.  Neurological: He is alert and oriented to person, place, and time.  Skin: Skin is warm and dry.  Psychiatric: He has a normal mood and affect.  Nursing note and vitals reviewed.   ED Course  Procedures (including critical care time) DIAGNOSTIC STUDIES: Oxygen Saturation is 98% on RA, normal by my interpretation.    COORDINATION OF CARE: 8:24 PM- Pt advised of plan for treatment and pt agrees.    Labs Review Labs Reviewed  I-STAT CHEM 8, ED - Abnormal; Notable for the following:    Glucose, Bld 103 (*)    All other components within normal limits  URINALYSIS, ROUTINE W REFLEX MICROSCOPIC   on repeat exam the patient is awake alert, no additional  complaints. Rectal exam demonstrates a nontender, normal prostate. We discussed multiple possibilities for his low back pain, urinary complaints. Patient will be started on anti-inflammatories, follow up with primary care this week.     MDM    Patient presents with concern of ongoing low back pain, urinary changes. Patient is awake, alert, afebrile, soft, non-peritoneal abdomen, and labs demonstrate no evidence for urinary tract infection, prostatitis.  Patient has no flank pain, no anterior abdominal pain, and today symptoms maybe secondary to inflammatory changes, with a musculoskeletal or other. No evidence for infection, bacteremia, sepsis. No evidence for neurologic dysfunction. Patient started on anti-inflammatories, will follow-up with primary care.  I personally performed the services described in this documentation, which was scribed in my presence. The recorded information has been reviewed and is accurate.     Jonathan Muskrat, MD 02/04/14 2153

## 2014-02-04 NOTE — ED Notes (Signed)
Bilateral flank pain x 3 days. Nausea earlier this morning. NAD

## 2014-04-05 ENCOUNTER — Other Ambulatory Visit (HOSPITAL_COMMUNITY): Payer: Self-pay | Admitting: Otolaryngology

## 2014-04-05 DIAGNOSIS — H8101 Meniere's disease, right ear: Secondary | ICD-10-CM

## 2014-04-10 ENCOUNTER — Ambulatory Visit (HOSPITAL_COMMUNITY)
Admission: RE | Admit: 2014-04-10 | Discharge: 2014-04-10 | Disposition: A | Discharge status: Home / Self Care | Payer: Self-pay | Source: Ambulatory Visit | Attending: Otolaryngology | Admitting: Otolaryngology

## 2014-04-10 ENCOUNTER — Other Ambulatory Visit (HOSPITAL_COMMUNITY): Payer: Self-pay | Admitting: Otolaryngology

## 2014-04-10 DIAGNOSIS — Z139 Encounter for screening, unspecified: Secondary | ICD-10-CM

## 2014-04-10 DIAGNOSIS — R1110 Vomiting, unspecified: Secondary | ICD-10-CM | POA: Insufficient documentation

## 2014-04-10 DIAGNOSIS — H8101 Meniere's disease, right ear: Secondary | ICD-10-CM

## 2014-04-10 DIAGNOSIS — H8109 Meniere's disease, unspecified ear: Secondary | ICD-10-CM | POA: Insufficient documentation

## 2014-04-10 DIAGNOSIS — R51 Headache: Secondary | ICD-10-CM | POA: Insufficient documentation

## 2014-04-10 LAB — POCT I-STAT CREATININE: CREATININE: 1.4 mg/dL — AB (ref 0.50–1.35)

## 2014-04-10 MED ORDER — GADOBENATE DIMEGLUMINE 529 MG/ML IV SOLN
17.0000 mL | Freq: Once | INTRAVENOUS | Status: AC | PRN
  Administered 2014-04-10: 17 mL via INTRAVENOUS

## 2014-06-02 ENCOUNTER — Emergency Department (HOSPITAL_COMMUNITY): Payer: Self-pay

## 2014-06-02 ENCOUNTER — Encounter (HOSPITAL_COMMUNITY): Payer: Self-pay | Admitting: Emergency Medicine

## 2014-06-02 ENCOUNTER — Emergency Department (HOSPITAL_COMMUNITY)
Admission: EM | Admit: 2014-06-02 | Discharge: 2014-06-02 | Disposition: A | Discharge status: Home / Self Care | Payer: Self-pay | Attending: Emergency Medicine | Admitting: Emergency Medicine

## 2014-06-02 DIAGNOSIS — R197 Diarrhea, unspecified: Secondary | ICD-10-CM | POA: Insufficient documentation

## 2014-06-02 DIAGNOSIS — E860 Dehydration: Secondary | ICD-10-CM | POA: Insufficient documentation

## 2014-06-02 DIAGNOSIS — R112 Nausea with vomiting, unspecified: Secondary | ICD-10-CM | POA: Insufficient documentation

## 2014-06-02 DIAGNOSIS — R059 Cough, unspecified: Secondary | ICD-10-CM

## 2014-06-02 DIAGNOSIS — Z87442 Personal history of urinary calculi: Secondary | ICD-10-CM | POA: Insufficient documentation

## 2014-06-02 DIAGNOSIS — R05 Cough: Secondary | ICD-10-CM | POA: Insufficient documentation

## 2014-06-02 DIAGNOSIS — M549 Dorsalgia, unspecified: Secondary | ICD-10-CM | POA: Insufficient documentation

## 2014-06-02 DIAGNOSIS — R1013 Epigastric pain: Secondary | ICD-10-CM | POA: Insufficient documentation

## 2014-06-02 DIAGNOSIS — I10 Essential (primary) hypertension: Secondary | ICD-10-CM | POA: Insufficient documentation

## 2014-06-02 DIAGNOSIS — R1010 Upper abdominal pain, unspecified: Secondary | ICD-10-CM | POA: Insufficient documentation

## 2014-06-02 DIAGNOSIS — Z79899 Other long term (current) drug therapy: Secondary | ICD-10-CM | POA: Insufficient documentation

## 2014-06-02 LAB — URINALYSIS, ROUTINE W REFLEX MICROSCOPIC
BILIRUBIN URINE: NEGATIVE
GLUCOSE, UA: NEGATIVE mg/dL
LEUKOCYTES UA: NEGATIVE
Nitrite: NEGATIVE
PROTEIN: NEGATIVE mg/dL
Specific Gravity, Urine: 1.02 (ref 1.005–1.030)
Urobilinogen, UA: 0.2 mg/dL (ref 0.0–1.0)
pH: 6 (ref 5.0–8.0)

## 2014-06-02 LAB — COMPREHENSIVE METABOLIC PANEL
ALBUMIN: 3.7 g/dL (ref 3.5–5.2)
ALT: 33 U/L (ref 0–53)
AST: 29 U/L (ref 0–37)
Alkaline Phosphatase: 58 U/L (ref 39–117)
Anion gap: 8 (ref 5–15)
BUN: 16 mg/dL (ref 6–23)
CALCIUM: 8.4 mg/dL (ref 8.4–10.5)
CO2: 22 mmol/L (ref 19–32)
Chloride: 106 mmol/L (ref 96–112)
Creatinine, Ser: 1.1 mg/dL (ref 0.50–1.35)
GFR calc Af Amer: 84 mL/min — ABNORMAL LOW (ref >90)
GFR, EST NON AFRICAN AMERICAN: 73 mL/min — AB (ref >90)
GLUCOSE: 89 mg/dL (ref 70–99)
POTASSIUM: 3.8 mmol/L (ref 3.5–5.1)
Sodium: 136 mmol/L (ref 135–145)
TOTAL PROTEIN: 7.2 g/dL (ref 6.0–8.3)
Total Bilirubin: 0.9 mg/dL (ref 0.3–1.2)

## 2014-06-02 LAB — CBC WITH DIFFERENTIAL/PLATELET
Basophils Absolute: 0 10*3/uL (ref 0.0–0.1)
Basophils Relative: 0 % (ref 0–1)
EOS ABS: 0.1 10*3/uL (ref 0.0–0.7)
Eosinophils Relative: 1 % (ref 0–5)
HCT: 48.5 % (ref 39.0–52.0)
Hemoglobin: 16.6 g/dL (ref 13.0–17.0)
LYMPHS ABS: 0.8 10*3/uL (ref 0.7–4.0)
LYMPHS PCT: 10 % — AB (ref 12–46)
MCH: 31.2 pg (ref 26.0–34.0)
MCHC: 34.2 g/dL (ref 30.0–36.0)
MCV: 91.2 fL (ref 78.0–100.0)
MONOS PCT: 10 % (ref 3–12)
Monocytes Absolute: 0.9 10*3/uL (ref 0.1–1.0)
Neutro Abs: 6.4 10*3/uL (ref 1.7–7.7)
Neutrophils Relative %: 79 % — ABNORMAL HIGH (ref 43–77)
Platelets: 216 10*3/uL (ref 150–400)
RBC: 5.32 MIL/uL (ref 4.22–5.81)
RDW: 14 % (ref 11.5–15.5)
WBC: 8.3 10*3/uL (ref 4.0–10.5)

## 2014-06-02 LAB — LIPASE, BLOOD: Lipase: 22 U/L (ref 11–59)

## 2014-06-02 LAB — URINE MICROSCOPIC-ADD ON

## 2014-06-02 MED ORDER — ONDANSETRON 4 MG PREPACK (~~LOC~~): 1.0000 | ORAL_TABLET | Freq: Three times a day (TID) | ORAL | Status: DC | PRN

## 2014-06-02 MED ORDER — PROMETHAZINE HCL 25 MG PO TABS: 25.0000 mg | ORAL_TABLET | Freq: Four times a day (QID) | ORAL | Status: DC | PRN

## 2014-06-02 MED ORDER — SODIUM CHLORIDE 0.9 % IV BOLUS (SEPSIS)
1000.0000 mL | Freq: Once | INTRAVENOUS | Status: AC
  Administered 2014-06-02: 1000 mL via INTRAVENOUS

## 2014-06-02 MED ORDER — OMEPRAZOLE 20 MG PO CPDR: 20.0000 mg | DELAYED_RELEASE_CAPSULE | Freq: Every day | ORAL | Status: DC

## 2014-06-02 MED ORDER — GUAIFENESIN-DM 100-10 MG/5ML PO SYRP
ORAL_SOLUTION | ORAL | Status: AC
  Administered 2014-06-02: 5 mL
  Filled 2014-06-02: qty 5

## 2014-06-02 MED ORDER — GUAIFENESIN-DM 100-10 MG/5ML PO SYRP: 5.0000 mL | ORAL_SOLUTION | ORAL | Status: DC | PRN

## 2014-06-02 NOTE — ED Notes (Signed)
Pt tolerating sips of ginger ale.

## 2014-06-02 NOTE — ED Provider Notes (Signed)
CSN: [id number]     Arrival date & time 06/02/14  1539 History  This chart was scribed for Davonna Belling, MD by Edison Simon, ED Scribe. This patient was seen in room APOTF/OTF and the patient's care was started at 4:46 PM.    Chief Complaint  Patient presents with  . Abdominal Pain   The history is provided by the patient. No language interpreter was used.    HPI Comments: [name] is a 59 y.o. male with history of diverticulitis who presents to the Emergency Department complaining of upper abdominal pain intermittently with onset 1 week ago. He states his abdomen hurts 2-3 times a day, better after eating. He reports associated nausea, vomiting, and diarrhea with onset yesterday. He vomited twice last night, but has not vomited today. He denies blood in emesis or stool. He also has back pain and cough that produces non-bloody mucous. He states he has been using Aleve for his symptoms. He denies alcohol use. He states he takes care of 2 grandchildren who have been sick. He denies fever.  Past Medical History  Diagnosis Date  . Hypertension   . Renal disorder     kidney stones 07-08   History reviewed. No pertinent past surgical history. History reviewed. No pertinent family history. History  Substance Use Topics  . Smoking status: Never Smoker   . Smokeless tobacco: Not on file  . Alcohol Use: No    Review of Systems  Constitutional: Negative for fever.  Respiratory: Positive for cough.   Gastrointestinal: Positive for nausea, vomiting, abdominal pain and diarrhea. Negative for blood in stool.  Musculoskeletal: Positive for back pain.  All other systems reviewed and are negative.     Allergies  Ciprofloxacin  Home Medications   Prior to Admission medications   Medication Sig Start Date End Date Taking? Authorizing Provider  amLODipine (NORVASC) 5 MG tablet Take 5 mg by mouth daily.   Yes Historical Provider, MD  naproxen sodium (ANAPROX) 220 MG tablet Take  220-440 mg by mouth daily as needed (for pain).   Yes Historical Provider, MD  cetirizine-pseudoephedrine (ZYRTEC-D) 5-120 MG per tablet Take 1 tablet by mouth daily. Patient not taking: Reported on 02/04/2014 09/02/12   Prentiss Bells, MD  guaiFENesin-dextromethorphan [facility] DM) 100-10 MG/5ML syrup Take 5 mLs by mouth every 4 (four) hours as needed for cough. 06/02/14   Davonna Belling, MD  meclizine (ANTIVERT) 50 MG tablet Take 1 tablet (50 mg total) by mouth 3 (three) times daily as needed. Patient not taking: Reported on 02/04/2014 09/02/12   Prentiss Bells, MD  omeprazole (PRILOSEC) 20 MG capsule Take 1 capsule (20 mg total) by mouth daily. 06/02/14   Davonna Belling, MD  ondansetron (ZOFRAN ODT) 4 MG disintegrating tablet Take 1 tablet (4 mg total) by mouth every 8 (eight) hours as needed for nausea. Patient not taking: Reported on 02/04/2014 09/02/12   Prentiss Bells, MD  ondansetron [facility]) 4 mg TABS tablet Take 4 tablets by mouth every 8 (eight) hours as needed. 06/02/14   Davonna Belling, MD  promethazine (PHENERGAN) 25 MG tablet Take 1 tablet (25 mg total) by mouth every 6 (six) hours as needed for nausea. 06/02/14   Davonna Belling, MD   BP 154/88 mmHg  Pulse 66  Temp(Src) 99.4 F (37.4 C) (Oral)  Resp 16  Ht 5\' 7"  (1.702 m)  Wt 185 lb (83.915 kg)  BMI 28.97 kg/m2  SpO2 97% Physical Exam  Constitutional: He is oriented to  person, place, and time. He appears well-developed and well-nourished.  HENT:  Head: Normocephalic and atraumatic.  Slight posterior pharyngeal erythema  Eyes: Conjunctivae are normal.  Neck: Normal range of motion. Neck supple.  Cardiovascular: Normal rate, regular rhythm and normal heart sounds.   No murmur heard. Pulmonary/Chest: Effort normal and breath sounds normal. No respiratory distress. He has no wheezes. He has no rales.  occasional cough  Abdominal: There is tenderness (mild epigastric tenderness).  No LLQ tenderness  Musculoskeletal:  Normal range of motion.  Neurological: He is alert and oriented to person, place, and time.  Skin: Skin is warm and dry.  Psychiatric: He has a normal mood and affect.  Nursing note and vitals reviewed.   ED Course  Procedures (including critical care time)   COORDINATION OF CARE: 4:53 PM Discussed treatment plan with patient at beside, including chest x-ray and blood work. The patient agrees with the plan and has no further questions at this time.   Labs Review Labs Reviewed  CBC WITH DIFFERENTIAL/PLATELET - Abnormal; Notable for the following:    Neutrophils Relative % 79 (*)    Lymphocytes Relative 10 (*)    All other components within normal limits  COMPREHENSIVE METABOLIC PANEL - Abnormal; Notable for the following:    GFR calc non Af Amer 73 (*)    GFR calc Af Amer 84 (*)    All other components within normal limits  URINALYSIS, ROUTINE W REFLEX MICROSCOPIC - Abnormal; Notable for the following:    Hgb urine dipstick MODERATE (*)    Ketones, ur TRACE (*)    All other components within normal limits  LIPASE, BLOOD  URINE MICROSCOPIC-ADD ON    Imaging Review Dg Chest 2 View  06/02/2014   CLINICAL DATA:  Patient with cough.  EXAM: CHEST  2 VIEW  COMPARISON:  04/15/2009  FINDINGS: Stable cardiac and mediastinal contours. No consolidative pulmonary opacities. No pleural effusion or pneumothorax. Mid thoracic spine degenerative changes.  IMPRESSION: No acute cardiopulmonary process.   Electronically Signed   By: Lovey Newcomer M.D.   On: 06/02/2014 17:45     EKG Interpretation None      MDM   Final diagnoses:  Pain of upper abdomen  Cough    Patient with cough and pain in his upper abdomen. X-ray reassuring. Mild dehydration. Feels better after treatment. Will discharge home to follow-up with PCP as needed.  I personally performed the services described in this documentation, which was scribed in my presence. The recorded information has been reviewed and is  accurate.    Davonna Belling, MD 06/02/14 2022

## 2014-06-02 NOTE — Discharge Instructions (Signed)
Cough, Adult  A cough is a reflex that helps clear your throat and airways. It can help heal the body or may be a reaction to an irritated airway. A cough may only last 2 or 3 weeks (acute) or may last more than 8 weeks (chronic).  CAUSES Acute cough:  Viral or bacterial infections. Chronic cough:  Infections.  Allergies.  Asthma.  Post-nasal drip.  Smoking.  Heartburn or acid reflux.  Some medicines.  Chronic lung problems (COPD).  Cancer. SYMPTOMS   Cough.  Fever.  Chest pain.  Increased breathing rate.  High-pitched whistling sound when breathing (wheezing).  Colored mucus that you cough up (sputum). TREATMENT   A bacterial cough may be treated with antibiotic medicine.  A viral cough must run its course and will not respond to antibiotics.  Your caregiver may recommend other treatments if you have a chronic cough. HOME CARE INSTRUCTIONS   Only take over-the-counter or prescription medicines for pain, discomfort, or fever as directed by your caregiver. Use cough suppressants only as directed by your caregiver.  Use a cold steam vaporizer or humidifier in your bedroom or home to help loosen secretions.  Sleep in a semi-upright position if your cough is worse at night.  Rest as needed.  Stop smoking if you smoke. SEEK IMMEDIATE MEDICAL CARE IF:   You have pus in your sputum.  Your cough starts to worsen.  You cannot control your cough with suppressants and are losing sleep.  You begin coughing up blood.  You have difficulty breathing.  You develop pain which is getting worse or is uncontrolled with medicine.  You have a fever. MAKE SURE YOU:   Understand these instructions.  Will watch your condition.  Will get help right away if you are not doing well or get worse. Document Released: 08/21/2010 Document Revised: 05/17/2011 Document Reviewed: 08/21/2010 [facility] Patient Information 2015 [place], [place]. This information is not intended  to replace advice given to you by your health care provider. Make sure you discuss any questions you have with your health care provider.  Abdominal Pain Many things can cause abdominal pain. Usually, abdominal pain is not caused by a disease and will improve without treatment. It can often be observed and treated at home. Your health care provider will do a physical exam and possibly order blood tests and X-rays to help determine the seriousness of your pain. However, in many cases, more time must pass before a clear cause of the pain can be found. Before that point, your health care provider may not know if you need more testing or further treatment. HOME CARE INSTRUCTIONS  Monitor your abdominal pain for any changes. The following actions may help to alleviate any discomfort you are experiencing:  Only take over-the-counter or prescription medicines as directed by your health care provider.  Do not take laxatives unless directed to do so by your health care provider.  Try a clear liquid diet (broth, tea, or water) as directed by your health care provider. Slowly move to a bland diet as tolerated. SEEK MEDICAL CARE IF:  You have unexplained abdominal pain.  You have abdominal pain associated with nausea or diarrhea.  You have pain when you urinate or have a bowel movement.  You experience abdominal pain that wakes you in the night.  You have abdominal pain that is worsened or improved by eating food.  You have abdominal pain that is worsened with eating fatty foods.  You have a fever. [place]  CARE IF:   Your pain does not go away within 2 hours.  You keep throwing up (vomiting).  Your pain is felt only in portions of the abdomen, such as the right side or the left lower portion of the abdomen.  You pass bloody or black tarry stools. MAKE SURE YOU:  Understand these instructions.   Will watch your condition.   Will get help right away if you are not doing  well or get worse.  Document Released: 12/02/2004 Document Revised: 02/27/2013 Document Reviewed: 11/01/2012 [facility] Patient Information 2015 [place], [place]. This information is not intended to replace advice given to you by your health care provider. Make sure you discuss any questions you have with your health care provider.

## 2014-06-02 NOTE — ED Notes (Signed)
Patient complaining of upper abdominal pain, nausea, and vomiting x 7 days. States diarrhea started last night.

## 2014-06-10 MED FILL — Ondansetron HCl Tab 4 MG: ORAL | Qty: 4 | Status: AC

## 2015-08-29 ENCOUNTER — Encounter (HOSPITAL_COMMUNITY): Payer: Self-pay

## 2015-08-29 ENCOUNTER — Emergency Department (HOSPITAL_COMMUNITY): Payer: Self-pay

## 2015-08-29 ENCOUNTER — Inpatient Hospital Stay (HOSPITAL_COMMUNITY)
Admission: EM | Admit: 2015-08-29 | Discharge: 2015-09-02 | DRG: 418 | Disposition: A | Discharge status: Home / Self Care | Payer: Self-pay | Attending: Internal Medicine | Admitting: Internal Medicine

## 2015-08-29 DIAGNOSIS — K821 Hydrops of gallbladder: Secondary | ICD-10-CM | POA: Diagnosis present

## 2015-08-29 DIAGNOSIS — I10 Essential (primary) hypertension: Secondary | ICD-10-CM | POA: Diagnosis present

## 2015-08-29 DIAGNOSIS — K810 Acute cholecystitis: Secondary | ICD-10-CM

## 2015-08-29 DIAGNOSIS — Z87442 Personal history of urinary calculi: Secondary | ICD-10-CM

## 2015-08-29 DIAGNOSIS — Z883 Allergy status to other anti-infective agents status: Secondary | ICD-10-CM

## 2015-08-29 DIAGNOSIS — K8000 Calculus of gallbladder with acute cholecystitis without obstruction: Secondary | ICD-10-CM | POA: Diagnosis present

## 2015-08-29 DIAGNOSIS — K819 Cholecystitis, unspecified: Secondary | ICD-10-CM

## 2015-08-29 DIAGNOSIS — K8001 Calculus of gallbladder with acute cholecystitis with obstruction: Principal | ICD-10-CM | POA: Diagnosis present

## 2015-08-29 DIAGNOSIS — N200 Calculus of kidney: Secondary | ICD-10-CM

## 2015-08-29 DIAGNOSIS — R1010 Upper abdominal pain, unspecified: Secondary | ICD-10-CM

## 2015-08-29 LAB — CBC WITH DIFFERENTIAL/PLATELET
BASOS ABS: 0 10*3/uL (ref 0.0–0.1)
BASOS PCT: 0 %
Eosinophils Absolute: 0.1 10*3/uL (ref 0.0–0.7)
Eosinophils Relative: 1 %
HCT: 48.6 % (ref 39.0–52.0)
Hemoglobin: 16.8 g/dL (ref 13.0–17.0)
LYMPHS ABS: 1.1 10*3/uL (ref 0.7–4.0)
LYMPHS PCT: 9 %
MCH: 31.5 pg (ref 26.0–34.0)
MCHC: 34.6 g/dL (ref 30.0–36.0)
MCV: 91 fL (ref 78.0–100.0)
MONOS PCT: 11 %
Monocytes Absolute: 1.3 10*3/uL — ABNORMAL HIGH (ref 0.1–1.0)
NEUTROS ABS: 9.2 10*3/uL — AB (ref 1.7–7.7)
NEUTROS PCT: 79 %
PLATELETS: 203 10*3/uL (ref 150–400)
RBC: 5.34 MIL/uL (ref 4.22–5.81)
RDW: 14 % (ref 11.5–15.5)
WBC: 11.7 10*3/uL — AB (ref 4.0–10.5)

## 2015-08-29 LAB — COMPREHENSIVE METABOLIC PANEL
ALT: 27 U/L (ref 17–63)
AST: 21 U/L (ref 15–41)
Albumin: 4.2 g/dL (ref 3.5–5.0)
Alkaline Phosphatase: 51 U/L (ref 38–126)
Anion gap: 5 (ref 5–15)
BUN: 11 mg/dL (ref 6–20)
CHLORIDE: 104 mmol/L (ref 101–111)
CO2: 26 mmol/L (ref 22–32)
Calcium: 8.6 mg/dL — ABNORMAL LOW (ref 8.9–10.3)
Creatinine, Ser: 1.14 mg/dL (ref 0.61–1.24)
Glucose, Bld: 99 mg/dL (ref 65–99)
POTASSIUM: 3.8 mmol/L (ref 3.5–5.1)
SODIUM: 135 mmol/L (ref 135–145)
Total Bilirubin: 0.9 mg/dL (ref 0.3–1.2)
Total Protein: 7.4 g/dL (ref 6.5–8.1)

## 2015-08-29 LAB — I-STAT TROPONIN, ED: Troponin i, poc: 0 ng/mL (ref 0.00–0.08)

## 2015-08-29 LAB — LIPASE, BLOOD: LIPASE: 21 U/L (ref 11–51)

## 2015-08-29 MED ORDER — SODIUM CHLORIDE 0.9 % IV SOLN
INTRAVENOUS | Status: DC
  Administered 2015-08-29: 23:00:00 via INTRAVENOUS

## 2015-08-29 MED ORDER — DIATRIZOATE MEGLUMINE & SODIUM 66-10 % PO SOLN
ORAL | Status: AC
  Filled 2015-08-29: qty 30

## 2015-08-29 MED ORDER — FENTANYL CITRATE (PF) 100 MCG/2ML IJ SOLN
50.0000 ug | Freq: Once | INTRAMUSCULAR | Status: AC
  Administered 2015-08-29: 50 ug via INTRAVENOUS
  Filled 2015-08-29: qty 2

## 2015-08-29 MED ORDER — SODIUM CHLORIDE 0.9 % IV BOLUS (SEPSIS)
500.0000 mL | Freq: Once | INTRAVENOUS | Status: AC
  Administered 2015-08-29: 500 mL via INTRAVENOUS

## 2015-08-29 MED ORDER — ONDANSETRON HCL 4 MG/2ML IJ SOLN
4.0000 mg | Freq: Once | INTRAMUSCULAR | Status: AC
  Administered 2015-08-29: 4 mg via INTRAVENOUS
  Filled 2015-08-29: qty 2

## 2015-08-29 NOTE — ED Provider Notes (Signed)
CSN: [id number]     Arrival date & time 08/29/15  2104 History  By signing my name below, I, [name], attest that this documentation has been prepared under the direction and in the presence of [name], MD. Electronically Signed: Stephania Stanley, ED Scribe. 08/29/2015. 10:22 PM.   Chief Complaint  Patient presents with  . Abdominal Pain   Patient is a 60 y.o. male presenting with abdominal pain. The history is provided by the patient. No language interpreter was used.  Abdominal Pain Pain location:  Epigastric Pain quality: cramping   Pain radiates to:  Does not radiate Pain severity:  Moderate Onset quality:  Gradual Duration:  14 hours Timing:  Constant Progression:  Worsening Relieved by:  None tried Worsened by:  Nothing tried Ineffective treatments:  OTC medications and acetaminophen (Pepto Bismol, Tylenol) Associated symptoms: cough and nausea   Associated symptoms: no chest pain, no chills, no diarrhea, no dysuria, no fever, no sore throat and no vomiting     HPI Comments: [name] is a 60 y.o. male with a history of hypertension and kidney stones, who presents to the Emergency Department complaining of 6/10, non-radiating, constant, cramping generalized abdominal pain that is worst in the epigastric region, with onset about 14 hours ago, at 8 AM. Patient also complains of a cough, nasal congestion, nausea, and headache this morning. Patient states he had taken 3 Tylenol this morning for a headache, and he thought the Tylenol might have caused his abdominal pain. He denies a history of abdominal surgeries. Per medical records, patient was seen in the ED on 06/02/14 for a cough and upper abdominal pain; a CXR at that time was negative, and patient was discharged home after feeling improved from IV fluids and cough medications in the ED. Patient states the etiology of his abdominal pain at that time was undetermined. He states he had taken Pepto Bismol today with minimal  relief. He denies fever, chills, visual changes, rhinorrhea, sore throat, chest pain, SOB, vomiting, diarrhea, dysuria, leg swelling, bleeding easily, back pain, or rash. Patient states his wife drove him here today.   Past Medical History  Diagnosis Date  . Hypertension   . Renal disorder     kidney stones 07-08   History reviewed. No pertinent past surgical history. No family history on file. Social History  Substance Use Topics  . Smoking status: Never Smoker   . Smokeless tobacco: None  . Alcohol Use: No    Review of Systems  Constitutional: Negative for fever and chills.  HENT: Positive for congestion. Negative for rhinorrhea and sore throat.   Eyes: Negative for visual disturbance.  Respiratory: Positive for cough.   Cardiovascular: Negative for chest pain and leg swelling.  Gastrointestinal: Positive for nausea and abdominal pain. Negative for vomiting and diarrhea.  Genitourinary: Negative for dysuria.  Musculoskeletal: Negative for back pain.  Skin: Negative for rash.  Neurological: Positive for headaches.  Hematological: Does not bruise/bleed easily.  All other systems reviewed and are negative.     Allergies  Ciprofloxacin  Home Medications   Prior to Admission medications   Medication Sig Start Date End Date Taking? Authorizing Provider  acetaminophen (TYLENOL) 500 MG tablet Take 500-1,500 mg by mouth every 6 (six) hours as needed for mild pain or moderate pain.   Yes Historical Provider, MD  alum & mag hydroxide-simeth (ANTACID) 200-200-20 MG/5ML suspension Take 30 mLs by mouth every 6 (six) hours as needed for indigestion or heartburn.   Yes  Historical Provider, MD  bismuth subsalicylate (PEPTO BISMOL) 262 MG/15ML suspension Take 30 mLs by mouth every 4 (four) hours as needed for indigestion or diarrhea or loose stools.   Yes Historical Provider, MD   BP 194/115 mmHg  Pulse 60  Temp(Src) 98.1 F (36.7 C) (Oral)  Resp 17  Ht 5\' 7"  (1.702 m)  Wt 90.719  kg  BMI 31.32 kg/m2  SpO2 96% Physical Exam  Constitutional: He is oriented to person, place, and time. He appears well-developed and well-nourished. No distress.  HENT:  Head: Normocephalic and atraumatic.  Mouth/Throat: Oropharynx is clear and moist.  Mucous membranes are moist.   Eyes: Conjunctivae and EOM are normal. Pupils are equal, round, and reactive to light. No scleral icterus.  Pupils are normal. Scleras are clear. Eyes track normal.    Neck: Neck supple. No tracheal deviation present.  Cardiovascular: Normal rate, regular rhythm and normal heart sounds.   Pulmonary/Chest: Effort normal and breath sounds normal. No respiratory distress. He has no wheezes. He has no rales.  Lungs are clear to auscultation.  Abdominal: Soft. Bowel sounds are normal. There is no tenderness.  Abdomen is non-tender to palpation.   Musculoskeletal: Normal range of motion. He exhibits no edema.  No ankle swelling.  Neurological: He is alert and oriented to person, place, and time.  Skin: Skin is warm and dry.  Psychiatric: He has a normal mood and affect. His behavior is normal.  Nursing note and vitals reviewed.   ED Course  Procedures (including critical care time)  DIAGNOSTIC STUDIES: Oxygen Saturation is 97% on RA, normal by my interpretation.    COORDINATION OF CARE: 10:10 PM - Discussed treatment plan with pt at bedside which includes IV placement, CT abdomen, and CXR. Pt verbalized understanding and agreed to plan.   Labs Review Labs Reviewed  COMPREHENSIVE METABOLIC PANEL - Abnormal; Notable for the following:    Calcium 8.6 (*)    All other components within normal limits  CBC WITH DIFFERENTIAL/PLATELET - Abnormal; Notable for the following:    WBC 11.7 (*)    Neutro Abs 9.2 (*)    Monocytes Absolute 1.3 (*)    All other components within normal limits  LIPASE, BLOOD  I-STAT TROPOININ, ED   Results for orders placed or performed during the hospital encounter of 08/29/15   Lipase, blood  Result Value Ref Range   Lipase 21 11 - 51 U/L  Comprehensive metabolic panel  Result Value Ref Range   Sodium 135 135 - 145 mmol/L   Potassium 3.8 3.5 - 5.1 mmol/L   Chloride 104 101 - 111 mmol/L   CO2 26 22 - 32 mmol/L   Glucose, Bld 99 65 - 99 mg/dL   BUN 11 6 - 20 mg/dL   Creatinine, Ser 1.14 0.61 - 1.24 mg/dL   Calcium 8.6 (L) 8.9 - 10.3 mg/dL   Total Protein 7.4 6.5 - 8.1 g/dL   Albumin 4.2 3.5 - 5.0 g/dL   AST 21 15 - 41 U/L   ALT 27 17 - 63 U/L   Alkaline Phosphatase 51 38 - 126 U/L   Total Bilirubin 0.9 0.3 - 1.2 mg/dL   GFR calc non Af Amer >60 >60 mL/min   GFR calc Af Amer >60 >60 mL/min   Anion gap 5 5 - 15  CBC with Differential/Platelet  Result Value Ref Range   WBC 11.7 (H) 4.0 - 10.5 K/uL   RBC 5.34 4.22 - 5.81 MIL/uL   Hemoglobin 16.8  13.0 - 17.0 g/dL   HCT 48.6 39.0 - 52.0 %   MCV 91.0 78.0 - 100.0 fL   MCH 31.5 26.0 - 34.0 pg   MCHC 34.6 30.0 - 36.0 g/dL   RDW 14.0 11.5 - 15.5 %   Platelets 203 150 - 400 K/uL   Neutrophils Relative % 79 %   Neutro Abs 9.2 (H) 1.7 - 7.7 K/uL   Lymphocytes Relative 9 %   Lymphs Abs 1.1 0.7 - 4.0 K/uL   Monocytes Relative 11 %   Monocytes Absolute 1.3 (H) 0.1 - 1.0 K/uL   Eosinophils Relative 1 %   Eosinophils Absolute 0.1 0.0 - 0.7 K/uL   Basophils Relative 0 %   Basophils Absolute 0.0 0.0 - 0.1 K/uL  I-Stat Troponin, ED (not at [facility])  Result Value Ref Range   Troponin i, poc 0.00 0.00 - 0.08 ng/mL   Comment 3             Imaging Review No results found. I have personally reviewed and evaluated these images and lab results as part of my medical decision-making.   EKG Interpretation None     ED ECG REPORT   Date: 08/29/2015  Rate: 67  Rhythm: normal sinus rhythm  QRS Axis: normal  Intervals: normal  ST/T Wave abnormalities: nonspecific ST changes  Conduction Disutrbances:none  Narrative Interpretation:   Old EKG Reviewed: none available  I have personally reviewed the EKG tracing  and agree with the computerized printout as noted.     MDM   Final diagnoses:  Pain of upper abdomen   Patient with acute onset of abdominal pain that moved up into the upper quadrants. His labs without significant abnormalities. Lipase isn't elevated liver function test without significant abnormalities. Troponin was negative.  Patient's disposition will be based upon the chest x-ray and the CT of the abdomen. If no acute findings warning admission patient can be discharged home.    I personally performed the services described in this documentation, which was scribed in my presence. The recorded information has been reviewed and is accurate.       [name], MD 08/29/15 [phone]

## 2015-08-29 NOTE — ED Notes (Signed)
I was hurting through the center of my abdomen and now it is hurting across the top of my abdomen.  It felt like my stomach was upset.  My stomach is really bothering me and I am nauseated.

## 2015-08-30 ENCOUNTER — Emergency Department (HOSPITAL_COMMUNITY): Payer: Self-pay

## 2015-08-30 ENCOUNTER — Encounter (HOSPITAL_COMMUNITY): Payer: Self-pay | Admitting: Internal Medicine

## 2015-08-30 ENCOUNTER — Observation Stay (HOSPITAL_COMMUNITY): Payer: Self-pay

## 2015-08-30 ENCOUNTER — Other Ambulatory Visit: Payer: Self-pay

## 2015-08-30 DIAGNOSIS — N200 Calculus of kidney: Secondary | ICD-10-CM

## 2015-08-30 DIAGNOSIS — K810 Acute cholecystitis: Secondary | ICD-10-CM | POA: Insufficient documentation

## 2015-08-30 DIAGNOSIS — I10 Essential (primary) hypertension: Secondary | ICD-10-CM

## 2015-08-30 LAB — URINALYSIS, ROUTINE W REFLEX MICROSCOPIC
BILIRUBIN URINE: NEGATIVE
GLUCOSE, UA: NEGATIVE mg/dL
Hgb urine dipstick: NEGATIVE
KETONES UR: NEGATIVE mg/dL
LEUKOCYTES UA: NEGATIVE
Nitrite: NEGATIVE
Protein, ur: NEGATIVE mg/dL
Specific Gravity, Urine: <1.005 — ABNORMAL LOW (ref 1.005–1.030)
pH: 7.5 (ref 5.0–8.0)

## 2015-08-30 LAB — RAPID URINE DRUG SCREEN, HOSP PERFORMED
AMPHETAMINES: NOT DETECTED
BARBITURATES: NOT DETECTED
Benzodiazepines: NOT DETECTED
Cocaine: NOT DETECTED
Opiates: NOT DETECTED
Tetrahydrocannabinol: NOT DETECTED

## 2015-08-30 MED ORDER — HYDROMORPHONE HCL 1 MG/ML IJ SOLN
1.0000 mg | INTRAMUSCULAR | Status: DC | PRN
  Administered 2015-08-30 – 2015-09-01 (×12): 1 mg via INTRAVENOUS
  Filled 2015-08-30 (×12): qty 1

## 2015-08-30 MED ORDER — HYDROMORPHONE HCL 1 MG/ML IJ SOLN
1.0000 mg | INTRAMUSCULAR | Status: DC | PRN
  Filled 2015-08-30: qty 1

## 2015-08-30 MED ORDER — KCL IN DEXTROSE-NACL 20-5-0.9 MEQ/L-%-% IV SOLN
INTRAVENOUS | Status: DC
  Administered 2015-08-30 – 2015-09-01 (×3): via INTRAVENOUS

## 2015-08-30 MED ORDER — HEPARIN SODIUM (PORCINE) 5000 UNIT/ML IJ SOLN
5000.0000 [IU] | Freq: Three times a day (TID) | INTRAMUSCULAR | Status: DC
  Administered 2015-08-30: 5000 [IU] via SUBCUTANEOUS
  Filled 2015-08-30: qty 1

## 2015-08-30 MED ORDER — ENOXAPARIN SODIUM 40 MG/0.4ML ~~LOC~~ SOLN
40.0000 mg | SUBCUTANEOUS | Status: DC
Start: 2015-08-30 — End: 2015-08-31
  Administered 2015-08-30 – 2015-08-31 (×2): 40 mg via SUBCUTANEOUS
  Filled 2015-08-30 (×2): qty 0.4

## 2015-08-30 MED ORDER — PANTOPRAZOLE SODIUM 40 MG PO TBEC
40.0000 mg | DELAYED_RELEASE_TABLET | Freq: Every day | ORAL | Status: DC
Start: 2015-08-30 — End: 2015-09-02
  Administered 2015-08-30 – 2015-09-02 (×4): 40 mg via ORAL
  Filled 2015-08-30 (×4): qty 1

## 2015-08-30 MED ORDER — ACETAMINOPHEN 325 MG PO TABS
650.0000 mg | ORAL_TABLET | Freq: Four times a day (QID) | ORAL | Status: DC | PRN
  Administered 2015-08-30 – 2015-08-31 (×4): 650 mg via ORAL
  Filled 2015-08-30 (×4): qty 2

## 2015-08-30 MED ORDER — AMLODIPINE BESYLATE 5 MG PO TABS
10.0000 mg | ORAL_TABLET | Freq: Every day | ORAL | Status: DC
  Administered 2015-08-30 – 2015-09-02 (×4): 10 mg via ORAL
  Filled 2015-08-30 (×4): qty 2

## 2015-08-30 MED ORDER — ONDANSETRON HCL 4 MG/2ML IJ SOLN
4.0000 mg | Freq: Four times a day (QID) | INTRAMUSCULAR | Status: DC | PRN
  Administered 2015-08-30 – 2015-08-31 (×5): 4 mg via INTRAVENOUS
  Filled 2015-08-30 (×5): qty 2

## 2015-08-30 MED ORDER — SODIUM CHLORIDE 0.9 % IV SOLN
3.0000 g | Freq: Once | INTRAVENOUS | Status: AC
  Administered 2015-08-30: 3 g via INTRAVENOUS
  Filled 2015-08-30: qty 3

## 2015-08-30 MED ORDER — HYDRALAZINE HCL 20 MG/ML IJ SOLN: 10.0000 mg | INTRAMUSCULAR | Status: DC | PRN

## 2015-08-30 MED ORDER — IOPAMIDOL (ISOVUE-300) INJECTION 61%
100.0000 mL | Freq: Once | INTRAVENOUS | Status: AC | PRN
  Administered 2015-08-30: 100 mL via INTRAVENOUS

## 2015-08-30 MED ORDER — PIPERACILLIN-TAZOBACTAM 3.375 G IVPB
3.3750 g | Freq: Three times a day (TID) | INTRAVENOUS | Status: DC
  Administered 2015-08-30 – 2015-09-02 (×10): 3.375 g via INTRAVENOUS
  Filled 2015-08-30 (×9): qty 50

## 2015-08-30 MED ORDER — ONDANSETRON HCL 4 MG PO TABS: 4.0000 mg | ORAL_TABLET | Freq: Four times a day (QID) | ORAL | Status: DC | PRN

## 2015-08-30 NOTE — Consult Note (Signed)
Reason for Consult: Right upper quadrant abdominal pain Referring Physician: Dr. Norwood Levo is an 60 y.o. male.  HPI: Patient is a 60 year old white male who presented to [facility] yesterday with worsening right upper quadrant abdominal pain, nausea, and vomiting. He states he has a history kidney stones, but this was different than his previous pain. The pain is in the upper abdomen rating across the top portion of his abdomen. He denies any fever, chills, or jaundice. As the pain worsened, he came to the emergency room for further evaluation and treatment. He states this occurred soon after eating. The pain has been intermittently controlled with pain medication. His appetite is decreased. The pain is crampy in nature.  Past Medical History  Diagnosis Date  . Hypertension   . Renal disorder     kidney stones 07-08    History reviewed. No pertinent past surgical history.  History reviewed. No pertinent family history.  Social History:  reports that he has never smoked. He does not have any smokeless tobacco history on file. He reports that he does not drink alcohol or use illicit drugs.  Allergies:  Allergies  Allergen Reactions  . Ciprofloxacin Other (See Comments)    Reaction that resulted in infection in intestines    Medications:  Prior to Admission:  Prescriptions prior to admission  Medication Sig Dispense Refill Last Dose  . acetaminophen (TYLENOL) 500 MG tablet Take 500-1,500 mg by mouth every 6 (six) hours as needed for mild pain or moderate pain.   08/29/2015 at Unknown time  . alum & mag hydroxide-simeth (ANTACID) 200-200-20 MG/5ML suspension Take 30 mLs by mouth every 6 (six) hours as needed for indigestion or heartburn.   08/29/2015 at Unknown time  . bismuth subsalicylate (PEPTO BISMOL) 262 MG/15ML suspension Take 30 mLs by mouth every 4 (four) hours as needed for indigestion or diarrhea or loose stools.   08/29/2015 at Unknown time    Scheduled: . amLODipine  10 mg Oral Daily  . diatrizoate meglumine-sodium      . diatrizoate meglumine-sodium      . heparin  5,000 Units Subcutaneous Q8H  . piperacillin-tazobactam  3.375 g Intravenous Q8H    Results for orders placed or performed during the hospital encounter of 08/29/15 (from the past 48 hour(s))  Lipase, blood     Status: None   Collection Time: 08/29/15 10:23 PM  Result Value Ref Range   Lipase 21 11 - 51 U/L  Comprehensive metabolic panel     Status: Abnormal   Collection Time: 08/29/15 10:23 PM  Result Value Ref Range   Sodium 135 135 - 145 mmol/L   Potassium 3.8 3.5 - 5.1 mmol/L   Chloride 104 101 - 111 mmol/L   CO2 26 22 - 32 mmol/L   Glucose, Bld 99 65 - 99 mg/dL   BUN 11 6 - 20 mg/dL   Creatinine, Ser 1.14 0.61 - 1.24 mg/dL   Calcium 8.6 (L) 8.9 - 10.3 mg/dL   Total Protein 7.4 6.5 - 8.1 g/dL   Albumin 4.2 3.5 - 5.0 g/dL   AST 21 15 - 41 U/L   ALT 27 17 - 63 U/L   Alkaline Phosphatase 51 38 - 126 U/L   Total Bilirubin 0.9 0.3 - 1.2 mg/dL   GFR calc non Af Amer >60 >60 mL/min   GFR calc Af Amer >60 >60 mL/min    Comment: (NOTE) The eGFR has been calculated using the CKD EPI equation. This  calculation has not been validated in all clinical situations. eGFR's persistently <60 mL/min signify possible Chronic Kidney Disease.    Anion gap 5 5 - 15  CBC with Differential/Platelet     Status: Abnormal   Collection Time: 08/29/15 10:23 PM  Result Value Ref Range   WBC 11.7 (H) 4.0 - 10.5 K/uL   RBC 5.34 4.22 - 5.81 MIL/uL   Hemoglobin 16.8 13.0 - 17.0 g/dL   HCT 48.6 39.0 - 52.0 %   MCV 91.0 78.0 - 100.0 fL   MCH 31.5 26.0 - 34.0 pg   MCHC 34.6 30.0 - 36.0 g/dL   RDW 14.0 11.5 - 15.5 %   Platelets 203 150 - 400 K/uL   Neutrophils Relative % 79 %   Neutro Abs 9.2 (H) 1.7 - 7.7 K/uL   Lymphocytes Relative 9 %   Lymphs Abs 1.1 0.7 - 4.0 K/uL   Monocytes Relative 11 %   Monocytes Absolute 1.3 (H) 0.1 - 1.0 K/uL   Eosinophils Relative 1 %    Eosinophils Absolute 0.1 0.0 - 0.7 K/uL   Basophils Relative 0 %   Basophils Absolute 0.0 0.0 - 0.1 K/uL  I-Stat Troponin, ED (not at [facility])     Status: None   Collection Time: 08/29/15 10:58 PM  Result Value Ref Range   Troponin i, poc 0.00 0.00 - 0.08 ng/mL   Comment 3            Comment: Due to the release kinetics of cTnI, a negative result within the first hours of the onset of symptoms does not rule out myocardial infarction with certainty. If myocardial infarction is still suspected, repeat the test at appropriate intervals.   Urine rapid drug screen (hosp performed)     Status: None   Collection Time: 08/30/15 12:40 AM  Result Value Ref Range   Opiates NONE DETECTED NONE DETECTED   Cocaine NONE DETECTED NONE DETECTED   Benzodiazepines NONE DETECTED NONE DETECTED   Amphetamines NONE DETECTED NONE DETECTED   Tetrahydrocannabinol NONE DETECTED NONE DETECTED   Barbiturates NONE DETECTED NONE DETECTED    Comment:        DRUG SCREEN FOR MEDICAL PURPOSES ONLY.  IF CONFIRMATION IS NEEDED FOR ANY PURPOSE, NOTIFY LAB WITHIN 5 DAYS.        LOWEST DETECTABLE LIMITS FOR URINE DRUG SCREEN Drug Class       Cutoff (ng/mL) Amphetamine      1000 Barbiturate      200 Benzodiazepine   998 Tricyclics       338 Opiates          300 Cocaine          300 THC              50   Urinalysis, Routine w reflex microscopic     Status: Abnormal   Collection Time: 08/30/15 12:40 AM  Result Value Ref Range   Color, Urine YELLOW YELLOW   APPearance CLEAR CLEAR   Specific Gravity, Urine <1.005 (L) 1.005 - 1.030   pH 7.5 5.0 - 8.0   Glucose, UA NEGATIVE NEGATIVE mg/dL   Hgb urine dipstick NEGATIVE NEGATIVE   Bilirubin Urine NEGATIVE NEGATIVE   Ketones, ur NEGATIVE NEGATIVE mg/dL   Protein, ur NEGATIVE NEGATIVE mg/dL   Nitrite NEGATIVE NEGATIVE   Leukocytes, UA NEGATIVE NEGATIVE    Comment: MICROSCOPIC NOT DONE ON URINES WITH NEGATIVE PROTEIN, BLOOD, LEUKOCYTES, NITRITE, OR GLUCOSE <1000  mg/dL.    Dg Chest 2  View  08/30/2015  CLINICAL DATA:  Acute onset of central abdominal pain and nausea. Initial encounter. EXAM: CHEST  2 VIEW COMPARISON:  Chest radiograph performed 06/02/2014 FINDINGS: The lungs are well-aerated and clear. There is no evidence of focal opacification, pleural effusion or pneumothorax. The heart is normal in size; the mediastinal contour is within normal limits. No acute osseous abnormalities are seen. IMPRESSION: No acute cardiopulmonary process seen. Electronically Signed   By: Garald Balding M.D.   On: 08/30/2015 01:10   Ct Abdomen Pelvis W Contrast  08/30/2015  CLINICAL DATA:  60 year old male with abdominal pain EXAM: CT ABDOMEN AND PELVIS WITH CONTRAST TECHNIQUE: Multidetector CT imaging of the abdomen and pelvis was performed using the standard protocol following bolus administration of intravenous contrast. CONTRAST:  145m ISOVUE-300 IOPAMIDOL (ISOVUE-300) INJECTION 61% COMPARISON:  CT dated 07/02/2006 FINDINGS: Minimal bibasilar atelectatic changes of the lungs. The visualized lung bases are otherwise clear. No intra-abdominal free air or free fluid. Probable mild fatty infiltration of the liver. The gallbladder is mildly distended. There is enhancement of the gallbladder mucosa with haziness of the gallbladder wall. Small pericholecystic fluid noted. No definite calcified stone identified. 80 noncalcified obstructing stone at the neck of the gallbladder or cystic duct is not excluded. Ultrasound is recommended for better evaluation of the gallbladder. The pancreas, spleen, adrenal glands appear unremarkable. Bilateral renal hypodense lesions measuring up to 18 mm in the upper pole of the left kidney most compatible with cysts. Small nonobstructing left renal inferior pole adjacent on with a combined diameter of 6 mm. There is no hydronephrosis on either side. The visualized ureters, the urinary bladder, the prostate, and seminal vesicles are grossly unremarkable.  There is sigmoid diverticulosis without active inflammatory changes. Moderate stool noted throughout the colon. There is no evidence of small-bowel obstruction or active inflammation. Small hiatal hernia noted. Normal appendix. The abdominal aorta and IVC appear unremarkable. No portal venous gas identified. There is no adenopathy. There is a small fat containing umbilical hernia. Partially visualized small bilateral testicular hydroceles. Ultrasound may provide better evaluation of scrotum and testicles. There is mild degenerative changes of the spine. No acute fracture. IMPRESSION: Inflammatory changes of the gallbladder most compatible with acute cholecystitis. No calcified gallstone identified. However a noncalcified stone at the neck of the gallbladder or proximal cystic duct is suspected. Ultrasound is recommended for better evaluation of the gallbladder. Small nonobstructing left renal calculus.  No hydronephrosis. Sigmoid diverticulosis. No evidence of bowel obstruction or active inflammation. Normal appendix. Electronically Signed   By: AAnner CreteM.D.   On: 08/30/2015 01:16    ROS:  Pertinent items noted in HPI and remainder of comprehensive ROS otherwise negative.  Blood pressure 203/101, pulse 69, temperature 98 F (36.7 C), temperature source Oral, resp. rate 18, height '5\' 7"'  (1.702 m), weight 97.614 kg (215 lb 3.2 oz), SpO2 98 %. Physical Exam: Pleasant white male in no acute distress. Head examination is normal cephalic, atraumatic. Eye examination reveals no scleral icterus. Neck is supple without lymphadenopathy. Lungs clear to auscultation with equal breath sounds bilaterally. Heart examination reveals a regular rate and rhythm without S3, S4, murmurs. Abdomen is soft with non-distinct tenderness in the upper abdomen. No rigidity is noted. Bowel sounds are decreased. No hepatosplenomegaly or masses are noted.  Assessment/Plan: Impression: Cholecystitis, question  cholelithiasis, hypertension Plan: Patient is to undergo ultrasound this morning. Further management pending the results. We'll also need his blood pressure under better control. May resume clear liquid diet once  ultrasound has been completed. Will follow with you.  Woody Kronberg A 08/30/2015, 9:39 AM

## 2015-08-30 NOTE — ED Provider Notes (Addendum)
Patient was left at change of shift to get results of his abdominal/pelvis CT scan. CT scan shows possible cholecystitis. Patient reports this morning he started having some lower abdominal pain that then went up to his upper abdomen this evening. He has had nausea without vomiting or diarrhea. We discussed his diet he has been eating a heavy Wynetta Emery all type diet, he ate a Electronics engineer plate the evening before for dinner, bacon and eggs for breakfast, and chicken bites at [employer] for lunch. We discussed he has an infection of his gallbladder, ultrasounds needed but not available at night. He is agreeable for admission.  02:13 AM after reviewing his AP CT, he was started on unasyn.  2:48 AM Dr. Marin Comment see patient and admit.  Dg Chest 2 View  08/30/2015  CLINICAL DATA:  Acute onset of central abdominal pain and nausea. Initial encounter. EXAM: CHEST  2 VIEW COMPARISON:  Chest radiograph performed 06/02/2014 FINDINGS: The lungs are well-aerated and clear. There is no evidence of focal opacification, pleural effusion or pneumothorax. The heart is normal in size; the mediastinal contour is within normal limits. No acute osseous abnormalities are seen. IMPRESSION: No acute cardiopulmonary process seen. Electronically Signed   By: Garald Balding M.D.   On: 08/30/2015 01:10   Ct Abdomen Pelvis W Contrast  08/30/2015  CLINICAL DATA:  60 year old male with abdominal pain EXAM: CT ABDOMEN AND PELVIS WITH CONTRAST TECHNIQUE: Multidetector CT imaging of the abdomen and pelvis was performed using the standard protocol following bolus administration of intravenous contrast. CONTRAST:  163mL ISOVUE-300 IOPAMIDOL (ISOVUE-300) INJECTION 61% COMPARISON:  CT dated 07/02/2006 FINDINGS: Minimal bibasilar atelectatic changes of the lungs. The visualized lung bases are otherwise clear. No intra-abdominal free air or free fluid. Probable mild fatty infiltration of the liver. The gallbladder is mildly distended. There is enhancement  of the gallbladder mucosa with haziness of the gallbladder wall. Small pericholecystic fluid noted. No definite calcified stone identified. 80 noncalcified obstructing stone at the neck of the gallbladder or cystic duct is not excluded. Ultrasound is recommended for better evaluation of the gallbladder. The pancreas, spleen, adrenal glands appear unremarkable. Bilateral renal hypodense lesions measuring up to 18 mm in the upper pole of the left kidney most compatible with cysts. Small nonobstructing left renal inferior pole adjacent on with a combined diameter of 6 mm. There is no hydronephrosis on either side. The visualized ureters, the urinary bladder, the prostate, and seminal vesicles are grossly unremarkable. There is sigmoid diverticulosis without active inflammatory changes. Moderate stool noted throughout the colon. There is no evidence of small-bowel obstruction or active inflammation. Small hiatal hernia noted. Normal appendix. The abdominal aorta and IVC appear unremarkable. No portal venous gas identified. There is no adenopathy. There is a small fat containing umbilical hernia. Partially visualized small bilateral testicular hydroceles. Ultrasound may provide better evaluation of scrotum and testicles. There is mild degenerative changes of the spine. No acute fracture. IMPRESSION: Inflammatory changes of the gallbladder most compatible with acute cholecystitis. No calcified gallstone identified. However a noncalcified stone at the neck of the gallbladder or proximal cystic duct is suspected. Ultrasound is recommended for better evaluation of the gallbladder. Small nonobstructing left renal calculus.  No hydronephrosis. Sigmoid diverticulosis. No evidence of bowel obstruction or active inflammation. Normal appendix. Electronically Signed   By: Anner Crete M.D.   On: 08/30/2015 01:16    Diagnoses that have been ruled out:  None  Diagnoses that are still under consideration:  None  Final  diagnoses:  Pain of upper abdomen  Cholecystitis   Plan admission  Rolland Porter, MD, Barbette Or, MD 08/30/15 [place], MD 08/30/15 [id number]

## 2015-08-30 NOTE — H&P (Signed)
History and Physical    [name] [id number] DOB: February 27, 1956 DOA: 08/29/2015  Referring MD/NP/PA: Rolland Porter, MD PCP: No PCP Per Patient Outpatient Specialists:  Patient coming from: home  Chief Complaint: Upper abdominal pain  HPI: [name] is a 60 y.o. male with medical history significant of HTN and nephrolithiasis presents with complaints of non-radiating, constant, cramping, upper abdominal pain onset this morning with associated nausea. He denies any vomiting or diarrhea. He has been eating a high fat diet recently, but did not eat dinner due to abd pain. He does not drink alcohol. He denies any other associated symptoms.   ED Course: While in the ED, bloodwork was mostly unremarkable, though WBC was minimally elevated. CT abd/pelvis showed changes consistent with acute cholecystitis. Patient was started IV Unasyn and hospitalist was asked to refer for admission for further management.     Review of Systems: As per HPI otherwise 10 point review of systems negative.   Past Medical History  Diagnosis Date  . Hypertension   . Renal disorder     kidney stones 07-08    History reviewed. No pertinent past surgical history.   reports that he has never smoked. He does not have any smokeless tobacco history on file. He reports that he does not drink alcohol or use illicit drugs.  Allergies  Allergen Reactions  . Ciprofloxacin Other (See Comments)    Reaction that resulted in infection in intestines    No family history on file.  Prior to Admission medications   Medication Sig Start Date End Date Taking? Authorizing Provider  acetaminophen (TYLENOL) 500 MG tablet Take 500-1,500 mg by mouth every 6 (six) hours as needed for mild pain or moderate pain.   Yes Historical Provider, MD  alum & mag hydroxide-simeth (ANTACID) 200-200-20 MG/5ML suspension Take 30 mLs by mouth every 6 (six) hours as needed for indigestion or heartburn.   Yes Historical Provider, MD  bismuth  subsalicylate (PEPTO BISMOL) 262 MG/15ML suspension Take 30 mLs by mouth every 4 (four) hours as needed for indigestion or diarrhea or loose stools.   Yes Historical Provider, MD    Physical Exam: Filed Vitals:   08/29/15 2230 08/29/15 2330 08/30/15 0200 08/30/15 0230  BP: 180/116 194/115 168/96   Pulse: 68 60 69   Temp:      TempSrc:      Resp: 17 17 17 17   Height:      Weight:      SpO2: 95% 96% 97%    Constitutional: NAD, calm, comfortable Filed Vitals:   08/29/15 2230 08/29/15 2330 08/30/15 0200 08/30/15 0230  BP: 180/116 194/115 168/96   Pulse: 68 60 69   Temp:      TempSrc:      Resp: 17 17 17 17   Height:      Weight:      SpO2: 95% 96% 97%    Eyes: PERRL, lids and conjunctivae normal ENMT: Mucous membranes are moist. Posterior pharynx clear of any exudate or lesions.Normal dentition.  Neck: normal, supple, no masses, no thyromegaly Respiratory: clear to auscultation bilaterally, no wheezing, no crackles. Normal respiratory effort. No accessory muscle use.  Cardiovascular: Regular rate and rhythm, no murmurs / rubs / gallops. No extremity edema. 2+ pedal pulses. No carotid bruits.  Abdomen: RUQ tenderness, no rebound, no masses palpated. No hepatosplenomegaly. Bowel sounds positive.  Musculoskeletal: no clubbing / cyanosis. No joint deformity upper and lower extremities. Good ROM, no contractures. Normal muscle tone.  Skin: no  rashes, lesions, ulcers. No induration Neurologic: CN 2-12 grossly intact. Sensation intact, DTR normal. Strength 5/5 in all 4.  Psychiatric: Normal judgment and insight. Alert and oriented x 3. Normal mood.   Labs on Admission: I have personally reviewed following labs and imaging studies  CBC:  Recent Labs Lab 08/29/15 2223  WBC 11.7*  NEUTROABS 9.2*  HGB 16.8  HCT 48.6  MCV 91.0  PLT 123456   Basic Metabolic Panel:  Recent Labs Lab 08/29/15 2223  NA 135  K 3.8  CL 104  CO2 26  GLUCOSE 99  BUN 11  CREATININE 1.14  CALCIUM  8.6*   GFR: Estimated Creatinine Clearance: 75.8 mL/min (by C-G formula based on Cr of 1.14). Liver Function Tests:  Recent Labs Lab 08/29/15 2223  AST 21  ALT 27  ALKPHOS 51  BILITOT 0.9  PROT 7.4  ALBUMIN 4.2    Recent Labs Lab 08/29/15 2223  LIPASE 21   Urine analysis:    Component Value Date/Time   COLORURINE YELLOW 08/30/2015 0040   APPEARANCEUR CLEAR 08/30/2015 0040   LABSPEC <1.005* 08/30/2015 0040   PHURINE 7.5 08/30/2015 0040   GLUCOSEU NEGATIVE 08/30/2015 0040   HGBUR NEGATIVE 08/30/2015 0040   BILIRUBINUR NEGATIVE 08/30/2015 0040   KETONESUR NEGATIVE 08/30/2015 0040   PROTEINUR NEGATIVE 08/30/2015 0040   UROBILINOGEN 0.2 06/02/2014 1720   NITRITE NEGATIVE 08/30/2015 0040   LEUKOCYTESUR NEGATIVE 08/30/2015 0040    Radiological Exams on Admission: Dg Chest 2 View  08/30/2015  CLINICAL DATA:  Acute onset of central abdominal pain and nausea. Initial encounter. EXAM: CHEST  2 VIEW COMPARISON:  Chest radiograph performed 06/02/2014 FINDINGS: The lungs are well-aerated and clear. There is no evidence of focal opacification, pleural effusion or pneumothorax. The heart is normal in size; the mediastinal contour is within normal limits. No acute osseous abnormalities are seen. IMPRESSION: No acute cardiopulmonary process seen. Electronically Signed   By: Garald Balding M.D.   On: 08/30/2015 01:10   Ct Abdomen Pelvis W Contrast  08/30/2015  CLINICAL DATA:  60 year old male with abdominal pain EXAM: CT ABDOMEN AND PELVIS WITH CONTRAST TECHNIQUE: Multidetector CT imaging of the abdomen and pelvis was performed using the standard protocol following bolus administration of intravenous contrast. CONTRAST:  172mL ISOVUE-300 IOPAMIDOL (ISOVUE-300) INJECTION 61% COMPARISON:  CT dated 07/02/2006 FINDINGS: Minimal bibasilar atelectatic changes of the lungs. The visualized lung bases are otherwise clear. No intra-abdominal free air or free fluid. Probable mild fatty infiltration of  the liver. The gallbladder is mildly distended. There is enhancement of the gallbladder mucosa with haziness of the gallbladder wall. Small pericholecystic fluid noted. No definite calcified stone identified. 80 noncalcified obstructing stone at the neck of the gallbladder or cystic duct is not excluded. Ultrasound is recommended for better evaluation of the gallbladder. The pancreas, spleen, adrenal glands appear unremarkable. Bilateral renal hypodense lesions measuring up to 18 mm in the upper pole of the left kidney most compatible with cysts. Small nonobstructing left renal inferior pole adjacent on with a combined diameter of 6 mm. There is no hydronephrosis on either side. The visualized ureters, the urinary bladder, the prostate, and seminal vesicles are grossly unremarkable. There is sigmoid diverticulosis without active inflammatory changes. Moderate stool noted throughout the colon. There is no evidence of small-bowel obstruction or active inflammation. Small hiatal hernia noted. Normal appendix. The abdominal aorta and IVC appear unremarkable. No portal venous gas identified. There is no adenopathy. There is a small fat containing umbilical hernia. Partially visualized  small bilateral testicular hydroceles. Ultrasound may provide better evaluation of scrotum and testicles. There is mild degenerative changes of the spine. No acute fracture. IMPRESSION: Inflammatory changes of the gallbladder most compatible with acute cholecystitis. No calcified gallstone identified. However a noncalcified stone at the neck of the gallbladder or proximal cystic duct is suspected. Ultrasound is recommended for better evaluation of the gallbladder. Small nonobstructing left renal calculus.  No hydronephrosis. Sigmoid diverticulosis. No evidence of bowel obstruction or active inflammation. Normal appendix. Electronically Signed   By: Anner Crete M.D.   On: 08/30/2015 01:16    EKG: Independently reviewed.    Assessment/Plan Principal Problem:   Acute acalculous cholecystitis Active Problems:   Nephrolithiasis   HTN (hypertension)   1. Acute acalculous cholecystitis. CT abd/pelvis showed changes c/w acute cholecystitis. WBC minimally elevated. Will limit diet to clear liquids, give fluids and pain meds. Continue IV Unasyn. Will consult general surgery in the am. Will order RUQ [place].  2. HTN. Continue outpatient regimen. 3. Nephrolithiasis. UA unremarkable.   DVT prophylaxis: Lovenox Code Status: Full Family Communication: discussed with patient. No family present at bedside. Disposition Plan: admit to medical floor. Discharge home once improved Consults called: general surgery Admission status: admit as inpatient  Orvan Falconer, MD FACP [facility]  If 7PM-7AM, please contact night-coverage [email or web address] Password [email or web address] 08/30/2015, 2:51 AM   By signing my name below, I, Delene Ruffini, attest that this documentation has been prepared under the direction and in the presence of Orvan Falconer, MD. Electronically Signed: Delene Ruffini, Scribe 08/30/2015 2:50am

## 2015-08-30 NOTE — Progress Notes (Signed)
Patient seen and examined, database reviewed. Admitted earlier today for right upper quadrant abdominal pain. Findings suspicious for acute cholecystitis on CT scan. Started on Zosyn, surgery is on board, awaiting results of right upper quadrant ultrasound to make further decisions.  Domingo Mend, MD [facility] Pager: [phone]

## 2015-08-31 DIAGNOSIS — K810 Acute cholecystitis: Secondary | ICD-10-CM

## 2015-08-31 LAB — BASIC METABOLIC PANEL
Anion gap: 4 — ABNORMAL LOW (ref 5–15)
BUN: 13 mg/dL (ref 6–20)
CO2: 27 mmol/L (ref 22–32)
CREATININE: 1.25 mg/dL — AB (ref 0.61–1.24)
Calcium: 8.2 mg/dL — ABNORMAL LOW (ref 8.9–10.3)
Chloride: 101 mmol/L (ref 101–111)
GFR calc Af Amer: >60 mL/min (ref >60)
Glucose, Bld: 101 mg/dL — ABNORMAL HIGH (ref 65–99)
Potassium: 3.5 mmol/L (ref 3.5–5.1)
SODIUM: 132 mmol/L — AB (ref 135–145)

## 2015-08-31 LAB — HEPATIC FUNCTION PANEL
ALK PHOS: 42 U/L (ref 38–126)
ALT: 43 U/L (ref 17–63)
AST: 32 U/L (ref 15–41)
Albumin: 3.3 g/dL — ABNORMAL LOW (ref 3.5–5.0)
BILIRUBIN INDIRECT: 1.3 mg/dL — AB (ref 0.3–0.9)
Bilirubin, Direct: 0.4 mg/dL (ref 0.1–0.5)
TOTAL PROTEIN: 6.5 g/dL (ref 6.5–8.1)
Total Bilirubin: 1.7 mg/dL — ABNORMAL HIGH (ref 0.3–1.2)

## 2015-08-31 LAB — CBC
HCT: 43.9 % (ref 39.0–52.0)
Hemoglobin: 14.8 g/dL (ref 13.0–17.0)
MCH: 31.3 pg (ref 26.0–34.0)
MCHC: 33.7 g/dL (ref 30.0–36.0)
MCV: 92.8 fL (ref 78.0–100.0)
PLATELETS: 162 10*3/uL (ref 150–400)
RBC: 4.73 MIL/uL (ref 4.22–5.81)
RDW: 13.8 % (ref 11.5–15.5)
WBC: 10.1 10*3/uL (ref 4.0–10.5)

## 2015-08-31 MED ORDER — CHLORHEXIDINE GLUCONATE CLOTH 2 % EX PADS: 6.0000 | MEDICATED_PAD | Freq: Once | CUTANEOUS | Status: AC

## 2015-08-31 MED ORDER — PROMETHAZINE HCL 25 MG/ML IJ SOLN
12.5000 mg | Freq: Once | INTRAMUSCULAR | Status: AC
  Administered 2015-08-31: 12.5 mg via INTRAVENOUS
  Filled 2015-08-31: qty 1

## 2015-08-31 MED ORDER — CHLORHEXIDINE GLUCONATE CLOTH 2 % EX PADS
6.0000 | MEDICATED_PAD | Freq: Once | CUTANEOUS | Status: AC
  Administered 2015-08-31: 6 via TOPICAL

## 2015-08-31 NOTE — Progress Notes (Signed)
Patient Demographics:    [name], is a 60 y.o. male, DOB - 1956/02/25, [id number]  Admit date - 08/29/2015   Admitting Physician Orvan Falconer, MD  Outpatient Primary MD for the patient is No PCP Per Patient  LOS - 1   Chief Complaint  Patient presents with  . Abdominal Pain        Subjective:    [name] today has no fevers,    No chest pain,  With n/v, no diarrhea , Emesis is without bile or blood  Assessment  & Plan :    Principal Problem:   Acute acalculous cholecystitis Active Problems:   Nephrolithiasis   HTN (hypertension)   1)Acute Calculous Cholecystitis- CT abdomen and pelvic report noted, ultrasound of the abdomen report noted, surgical consult noted , the patient will  Have  laparoscopic cholecystectomy on 09/01/2015, continue Zosyn. Hold Lovenox  2)HTN- stable, c/n amlodipine   Code Status : full   Disposition Plan  : home  Consults  :  Surgery   DVT Prophylaxis  :   SCDs Lab Results  Component Value Date   PLT 162 08/31/2015    Inpatient Medications  Scheduled Meds: . amLODipine  10 mg Oral Daily  . Chlorhexidine Gluconate Cloth  6 each Topical Once   And  . Chlorhexidine Gluconate Cloth  6 each Topical Once  . enoxaparin (LOVENOX) injection  40 mg Subcutaneous Q24H  . pantoprazole  40 mg Oral Q1200  . piperacillin-tazobactam  3.375 g Intravenous Q8H   Continuous Infusions: . dextrose 5 % and 0.9 % NaCl with KCl 20 mEq/L 100 mL/hr at 08/31/15 0226   PRN Meds:.acetaminophen, hydrALAZINE, HYDROmorphone (DILAUDID) injection, ondansetron **OR** ondansetron (ZOFRAN) IV    Anti-infectives    Start     Dose/Rate Route Frequency Ordered Stop   08/30/15 0415  piperacillin-tazobactam (ZOSYN) IVPB 3.375 g     3.375 g 12.5 mL/hr over 240 Minutes Intravenous Every 8 hours 08/30/15 0404     08/30/15 0215  Ampicillin-Sulbactam (UNASYN) 3 g in sodium chloride  0.9 % 100 mL IVPB     3 g 100 mL/hr over 60 Minutes Intravenous  Once 08/30/15 0213 08/30/15 0347        Objective:   Filed Vitals:   08/30/15 2118 08/31/15 0600 08/31/15 1101 08/31/15 1516  BP: 141/86 134/78 162/91 185/96  Pulse: 63 62  80  Temp: 98.1 F (36.7 C) 98.7 F (37.1 C)  99.1 F (37.3 C)  TempSrc: Oral Oral  Oral  Resp: 20 20  18   Height:      Weight:      SpO2: 95% 98%  97%    Wt Readings from Last 3 Encounters:  08/30/15 97.614 kg (215 lb 3.2 oz)  06/02/14 83.915 kg (185 lb)  04/10/14 81.647 kg (180 lb)     Intake/Output Summary (Last 24 hours) at 08/31/15 1719 Last data filed at 08/31/15 1230  Gross per 24 hour  Intake    560 ml  Output      0 ml  Net    560 ml     Physical Exam  Gen:- Awake Alert,   HEENT:- [place].AT, No sclera icterus Neck-Supple Neck,No JVD,.  Lungs-  CTAB  CV- S1, S2  normal Abd-  +ve B.Sounds, Abd Soft, Epigastric and right upper quadrant tenderness, no rebound    Extremity/Skin:- No  edema,   no jaundice    Data Review:   Micro Results No results found for this or any previous visit (from the past 240 hour(s)).  Radiology Reports Dg Chest 2 View  08/30/2015  CLINICAL DATA:  Acute onset of central abdominal pain and nausea. Initial encounter. EXAM: CHEST  2 VIEW COMPARISON:  Chest radiograph performed 06/02/2014 FINDINGS: The lungs are well-aerated and clear. There is no evidence of focal opacification, pleural effusion or pneumothorax. The heart is normal in size; the mediastinal contour is within normal limits. No acute osseous abnormalities are seen. IMPRESSION: No acute cardiopulmonary process seen. Electronically Signed   By: Garald Balding M.D.   On: 08/30/2015 01:10   Ct Abdomen Pelvis W Contrast  08/30/2015  CLINICAL DATA:  60 year old male with abdominal pain EXAM: CT ABDOMEN AND PELVIS WITH CONTRAST TECHNIQUE: Multidetector CT imaging of the abdomen and pelvis was performed using the standard protocol following bolus  administration of intravenous contrast. CONTRAST:  155mL ISOVUE-300 IOPAMIDOL (ISOVUE-300) INJECTION 61% COMPARISON:  CT dated 07/02/2006 FINDINGS: Minimal bibasilar atelectatic changes of the lungs. The visualized lung bases are otherwise clear. No intra-abdominal free air or free fluid. Probable mild fatty infiltration of the liver. The gallbladder is mildly distended. There is enhancement of the gallbladder mucosa with haziness of the gallbladder wall. Small pericholecystic fluid noted. No definite calcified stone identified. 80 noncalcified obstructing stone at the neck of the gallbladder or cystic duct is not excluded. Ultrasound is recommended for better evaluation of the gallbladder. The pancreas, spleen, adrenal glands appear unremarkable. Bilateral renal hypodense lesions measuring up to 18 mm in the upper pole of the left kidney most compatible with cysts. Small nonobstructing left renal inferior pole adjacent on with a combined diameter of 6 mm. There is no hydronephrosis on either side. The visualized ureters, the urinary bladder, the prostate, and seminal vesicles are grossly unremarkable. There is sigmoid diverticulosis without active inflammatory changes. Moderate stool noted throughout the colon. There is no evidence of small-bowel obstruction or active inflammation. Small hiatal hernia noted. Normal appendix. The abdominal aorta and IVC appear unremarkable. No portal venous gas identified. There is no adenopathy. There is a small fat containing umbilical hernia. Partially visualized small bilateral testicular hydroceles. Ultrasound may provide better evaluation of scrotum and testicles. There is mild degenerative changes of the spine. No acute fracture. IMPRESSION: Inflammatory changes of the gallbladder most compatible with acute cholecystitis. No calcified gallstone identified. However a noncalcified stone at the neck of the gallbladder or proximal cystic duct is suspected. Ultrasound is  recommended for better evaluation of the gallbladder. Small nonobstructing left renal calculus.  No hydronephrosis. Sigmoid diverticulosis. No evidence of bowel obstruction or active inflammation. Normal appendix. Electronically Signed   By: Anner Crete M.D.   On: 08/30/2015 01:16   US Abdomen Limited Ruq  08/30/2015  CLINICAL DATA:  Postprandial upper abdominal pain for 1 day, possible cholecystitis EXAM: US ABDOMEN LIMITED - RIGHT UPPER QUADRANT COMPARISON:  CT scan 08/30/2015 FINDINGS: Gallbladder: [place] gallstones are noted within gallbladder. There is thickening of gallbladder wall up to 5 mm. Pericholecystic fluid is noted. Early cholecystitis cannot be excluded. Clinical correlation is necessary. Largest gallstone measures 1.4 cm. Common bile duct: Diameter: 6 mm in diameter within normal limits. Liver: No focal lesion identified. There is mild increased echogenicity of the liver suspicious for fatty infiltration. IMPRESSION: Mobile  gallstones are noted within gallbladder. There is thickening of gallbladder wall up to 5 mm. Pericholecystic fluid is noted. Acute cholecystitis cannot be excluded. Clinical correlation is necessary. Largest gallstone measures 1.4 cm. Normal CBD. Mild fatty infiltration of the liver pre Electronically Signed   By: Lahoma Crocker M.D.   On: 08/30/2015 11:31     CBC  Recent Labs Lab 08/29/15 2223 08/31/15 0559  WBC 11.7* 10.1  HGB 16.8 14.8  HCT 48.6 43.9  PLT 203 162  MCV 91.0 92.8  MCH 31.5 31.3  MCHC 34.6 33.7  RDW 14.0 13.8  LYMPHSABS 1.1  --   MONOABS 1.3*  --   EOSABS 0.1  --   BASOSABS 0.0  --     Chemistries   Recent Labs Lab 08/29/15 2223 08/31/15 0559  NA 135 132*  K 3.8 3.5  CL 104 101  CO2 26 27  GLUCOSE 99 101*  BUN 11 13  CREATININE 1.14 1.25*  CALCIUM 8.6* 8.2*  AST 21 32  ALT 27 43  ALKPHOS 51 42  BILITOT 0.9 1.7*    ------------------------------------------------------------------------------------------------------------------ No results for input(s): CHOL, HDL, LDLCALC, TRIG, CHOLHDL, LDLDIRECT in the last 72 hours.  No results found for: HGBA1C ------------------------------------------------------------------------------------------------------------------ No results for input(s): TSH, T4TOTAL, T3FREE, THYROIDAB in the last 72 hours.  Invalid input(s): FREET3 ------------------------------------------------------------------------------------------------------------------ No results for input(s): VITAMINB12, FOLATE, FERRITIN, TIBC, IRON, RETICCTPCT in the last 72 hours.  Coagulation profile No results for input(s): INR, PROTIME in the last 168 hours.  No results for input(s): DDIMER in the last 72 hours.  Cardiac Enzymes No results for input(s): CKMB, TROPONINI, MYOGLOBIN in the last 168 hours.  Invalid input(s): CK ------------------------------------------------------------------------------------------------------------------ No results found for: BNP   [name] M.D on 08/31/2015 at 5:19 PM  Between 7am to 7pm - Pager - [phone]  After 7pm go to [email or web address] - password [email or web address]  [facility] -  Office  [phone]  Dragon dictation system was used to create this note, attempts have been made to correct errors, however presence of uncorrected errors is not a reflection quality of care provided

## 2015-08-31 NOTE — Progress Notes (Signed)
Subjective: Patient still with nausea and had an episode of emesis. In retrospect, patient states he has felt bad for the past month.  Objective: Vital signs in last 24 hours: Temp:  [97.6 F (36.4 C)-98.7 F (37.1 C)] 98.7 F (37.1 C) (06/25 0600) Pulse Rate:  [62-63] 62 (06/25 0600) Resp:  [18-20] 20 (06/25 0600) BP: (128-141)/(74-86) 134/78 mmHg (06/25 0600) SpO2:  [93 %-98 %] 98 % (06/25 0600) Last BM Date: 08/29/15  Intake/Output from previous day: 06/24 0701 - 06/25 0700 In: 0  Out: 2 [Emesis/NG output:2] Intake/Output this shift:    General appearance: alert, cooperative and no distress Eyes: No scleral icterus Resp: clear to auscultation bilaterally Cardio: regular rate and rhythm, S1, S2 normal, no murmur, click, rub or gallop GI: Soft. Mild tenderness to deep palpation in the right upper quadrant. No hepatosplenomegaly, masses, or rigidity are noted.  Lab Results:   Recent Labs  08/29/15 2223 08/31/15 0559  WBC 11.7* 10.1  HGB 16.8 14.8  HCT 48.6 43.9  PLT 203 162   BMET  Recent Labs  08/29/15 2223 08/31/15 0559  NA 135 132*  K 3.8 3.5  CL 104 101  CO2 26 27  GLUCOSE 99 101*  BUN 11 13  CREATININE 1.14 1.25*  CALCIUM 8.6* 8.2*   PT/INR No results for input(s): LABPROT, INR in the last 72 hours.  Studies/Results: Dg Chest 2 View  08/30/2015  CLINICAL DATA:  Acute onset of central abdominal pain and nausea. Initial encounter. EXAM: CHEST  2 VIEW COMPARISON:  Chest radiograph performed 06/02/2014 FINDINGS: The lungs are well-aerated and clear. There is no evidence of focal opacification, pleural effusion or pneumothorax. The heart is normal in size; the mediastinal contour is within normal limits. No acute osseous abnormalities are seen. IMPRESSION: No acute cardiopulmonary process seen. Electronically Signed   By: Garald Balding M.D.   On: 08/30/2015 01:10   Ct Abdomen Pelvis W Contrast  08/30/2015  CLINICAL DATA:  60 year old male with  abdominal pain EXAM: CT ABDOMEN AND PELVIS WITH CONTRAST TECHNIQUE: Multidetector CT imaging of the abdomen and pelvis was performed using the standard protocol following bolus administration of intravenous contrast. CONTRAST:  152mL ISOVUE-300 IOPAMIDOL (ISOVUE-300) INJECTION 61% COMPARISON:  CT dated 07/02/2006 FINDINGS: Minimal bibasilar atelectatic changes of the lungs. The visualized lung bases are otherwise clear. No intra-abdominal free air or free fluid. Probable mild fatty infiltration of the liver. The gallbladder is mildly distended. There is enhancement of the gallbladder mucosa with haziness of the gallbladder wall. Small pericholecystic fluid noted. No definite calcified stone identified. 80 noncalcified obstructing stone at the neck of the gallbladder or cystic duct is not excluded. Ultrasound is recommended for better evaluation of the gallbladder. The pancreas, spleen, adrenal glands appear unremarkable. Bilateral renal hypodense lesions measuring up to 18 mm in the upper pole of the left kidney most compatible with cysts. Small nonobstructing left renal inferior pole adjacent on with a combined diameter of 6 mm. There is no hydronephrosis on either side. The visualized ureters, the urinary bladder, the prostate, and seminal vesicles are grossly unremarkable. There is sigmoid diverticulosis without active inflammatory changes. Moderate stool noted throughout the colon. There is no evidence of small-bowel obstruction or active inflammation. Small hiatal hernia noted. Normal appendix. The abdominal aorta and IVC appear unremarkable. No portal venous gas identified. There is no adenopathy. There is a small fat containing umbilical hernia. Partially visualized small bilateral testicular hydroceles. Ultrasound may provide better evaluation of scrotum and testicles. There  is mild degenerative changes of the spine. No acute fracture. IMPRESSION: Inflammatory changes of the gallbladder most compatible with  acute cholecystitis. No calcified gallstone identified. However a noncalcified stone at the neck of the gallbladder or proximal cystic duct is suspected. Ultrasound is recommended for better evaluation of the gallbladder. Small nonobstructing left renal calculus.  No hydronephrosis. Sigmoid diverticulosis. No evidence of bowel obstruction or active inflammation. Normal appendix. Electronically Signed   By: Anner Crete M.D.   On: 08/30/2015 01:16   US Abdomen Limited Ruq  08/30/2015  CLINICAL DATA:  Postprandial upper abdominal pain for 1 day, possible cholecystitis EXAM: US ABDOMEN LIMITED - RIGHT UPPER QUADRANT COMPARISON:  CT scan 08/30/2015 FINDINGS: Gallbladder: Mobile gallstones are noted within gallbladder. There is thickening of gallbladder wall up to 5 mm. Pericholecystic fluid is noted. Early cholecystitis cannot be excluded. Clinical correlation is necessary. Largest gallstone measures 1.4 cm. Common bile duct: Diameter: 6 mm in diameter within normal limits. Liver: No focal lesion identified. There is mild increased echogenicity of the liver suspicious for fatty infiltration. IMPRESSION: Mobile gallstones are noted within gallbladder. There is thickening of gallbladder wall up to 5 mm. Pericholecystic fluid is noted. Acute cholecystitis cannot be excluded. Clinical correlation is necessary. Largest gallstone measures 1.4 cm. Normal CBD. Mild fatty infiltration of the liver pre Electronically Signed   By: Lahoma Crocker M.D.   On: 08/30/2015 11:31    Anti-infectives: Anti-infectives    Start     Dose/Rate Route Frequency Ordered Stop   08/30/15 0415  piperacillin-tazobactam (ZOSYN) IVPB 3.375 g     3.375 g 12.5 mL/hr over 240 Minutes Intravenous Every 8 hours 08/30/15 0404     08/30/15 0215  Ampicillin-Sulbactam (UNASYN) 3 g in sodium chloride 0.9 % 100 mL IVPB     3 g 100 mL/hr over 60 Minutes Intravenous  Once 08/30/15 0213 08/30/15 0347      Assessment/Plan: Impression: Acute  cholecystitis, cholelithiasis. Blood pressure is much better. Plan: We'll take patient to the operating room tomorrow for laparoscopic cholecystectomy. The risks and benefits of the procedure including bleeding, infection, hepatobiliary injury, and the possibility of an open procedure were fully explained to the patient, who gave informed consent.  LOS: 1 day    Kelcey Korus A 08/31/2015

## 2015-09-01 ENCOUNTER — Encounter (HOSPITAL_COMMUNITY): Payer: Self-pay | Admitting: Anesthesiology

## 2015-09-01 ENCOUNTER — Inpatient Hospital Stay (HOSPITAL_COMMUNITY): Payer: Self-pay | Admitting: Anesthesiology

## 2015-09-01 ENCOUNTER — Encounter (HOSPITAL_COMMUNITY)
Admission: EM | Disposition: A | Discharge status: Home / Self Care | Payer: Self-pay | Source: Home / Self Care | Attending: Family Medicine

## 2015-09-01 DIAGNOSIS — K8000 Calculus of gallbladder with acute cholecystitis without obstruction: Secondary | ICD-10-CM

## 2015-09-01 DIAGNOSIS — K8001 Calculus of gallbladder with acute cholecystitis with obstruction: Secondary | ICD-10-CM | POA: Diagnosis present

## 2015-09-01 HISTORY — PX: CHOLECYSTECTOMY: SHX55

## 2015-09-01 LAB — COMPREHENSIVE METABOLIC PANEL
ALBUMIN: 3.4 g/dL — AB (ref 3.5–5.0)
ALK PHOS: 44 U/L (ref 38–126)
ALT: 36 U/L (ref 17–63)
ANION GAP: 5 (ref 5–15)
AST: 18 U/L (ref 15–41)
BILIRUBIN TOTAL: 1.3 mg/dL — AB (ref 0.3–1.2)
BUN: 11 mg/dL (ref 6–20)
CHLORIDE: 101 mmol/L (ref 101–111)
CO2: 26 mmol/L (ref 22–32)
CREATININE: 1.18 mg/dL (ref 0.61–1.24)
Calcium: 8.2 mg/dL — ABNORMAL LOW (ref 8.9–10.3)
GFR calc Af Amer: >60 mL/min (ref >60)
GFR calc non Af Amer: >60 mL/min (ref >60)
Glucose, Bld: 129 mg/dL — ABNORMAL HIGH (ref 65–99)
POTASSIUM: 4.1 mmol/L (ref 3.5–5.1)
Sodium: 132 mmol/L — ABNORMAL LOW (ref 135–145)
Total Protein: 7 g/dL (ref 6.5–8.1)

## 2015-09-01 LAB — CBC
HCT: 47.1 % (ref 39.0–52.0)
Hemoglobin: 15.8 g/dL (ref 13.0–17.0)
MCH: 30.7 pg (ref 26.0–34.0)
MCHC: 33.5 g/dL (ref 30.0–36.0)
MCV: 91.6 fL (ref 78.0–100.0)
PLATELETS: 187 10*3/uL (ref 150–400)
RBC: 5.14 MIL/uL (ref 4.22–5.81)
RDW: 13.5 % (ref 11.5–15.5)
WBC: 12.6 10*3/uL — AB (ref 4.0–10.5)

## 2015-09-01 LAB — MAGNESIUM: MAGNESIUM: 1.9 mg/dL (ref 1.7–2.4)

## 2015-09-01 LAB — DIFFERENTIAL
BASOS PCT: 0 %
Basophils Absolute: 0 10*3/uL (ref 0.0–0.1)
EOS PCT: 0 %
Eosinophils Absolute: 0 10*3/uL (ref 0.0–0.7)
Lymphocytes Relative: 7 %
Lymphs Abs: 0.9 10*3/uL (ref 0.7–4.0)
MONO ABS: 1.7 10*3/uL — AB (ref 0.1–1.0)
Monocytes Relative: 14 %
NEUTROS ABS: 9.9 10*3/uL — AB (ref 1.7–7.7)
Neutrophils Relative %: 79 %

## 2015-09-01 LAB — TRIGLYCERIDES: Triglycerides: 55 mg/dL (ref <150)

## 2015-09-01 LAB — SURGICAL PCR SCREEN
MRSA, PCR: NEGATIVE
Staphylococcus aureus: NEGATIVE

## 2015-09-01 LAB — PHOSPHORUS: Phosphorus: 1.6 mg/dL — ABNORMAL LOW (ref 2.5–4.6)

## 2015-09-01 LAB — PREALBUMIN: PREALBUMIN: 9.1 mg/dL — AB (ref 18–38)

## 2015-09-01 SURGERY — LAPAROSCOPIC CHOLECYSTECTOMY
Anesthesia: General | Site: Abdomen

## 2015-09-01 MED ORDER — OXYCODONE-ACETAMINOPHEN 5-325 MG PO TABS
1.0000 | ORAL_TABLET | ORAL | Status: DC | PRN
  Administered 2015-09-02: 2 via ORAL
  Filled 2015-09-01: qty 2

## 2015-09-01 MED ORDER — LACTATED RINGERS IV SOLN: INTRAVENOUS | Status: DC

## 2015-09-01 MED ORDER — HEMOSTATIC AGENTS (NO CHARGE) OPTIME
TOPICAL | Status: DC | PRN
  Administered 2015-09-01: 1 via TOPICAL

## 2015-09-01 MED ORDER — NEOSTIGMINE METHYLSULFATE 10 MG/10ML IV SOLN
INTRAVENOUS | Status: DC | PRN
  Administered 2015-09-01 (×2): 2 mg via INTRAVENOUS

## 2015-09-01 MED ORDER — ROCURONIUM BROMIDE 50 MG/5ML IV SOLN
INTRAVENOUS | Status: AC
Start: 2015-09-01 — End: 2015-09-01
  Filled 2015-09-01: qty 1

## 2015-09-01 MED ORDER — LABETALOL HCL 5 MG/ML IV SOLN
5.0000 mg | Freq: Once | INTRAVENOUS | Status: AC
  Administered 2015-09-01: 5 mg via INTRAVENOUS

## 2015-09-01 MED ORDER — DIPHENHYDRAMINE HCL 25 MG PO CAPS: 25.0000 mg | ORAL_CAPSULE | Freq: Four times a day (QID) | ORAL | Status: DC | PRN

## 2015-09-01 MED ORDER — POVIDONE-IODINE 10 % EX OINT
TOPICAL_OINTMENT | CUTANEOUS | Status: AC
  Filled 2015-09-01: qty 1

## 2015-09-01 MED ORDER — SODIUM CHLORIDE 0.9% FLUSH
INTRAVENOUS | Status: AC
  Filled 2015-09-01: qty 10

## 2015-09-01 MED ORDER — LIDOCAINE HCL (PF) 1 % IJ SOLN
INTRAMUSCULAR | Status: AC
  Filled 2015-09-01: qty 5

## 2015-09-01 MED ORDER — FENTANYL CITRATE (PF) 250 MCG/5ML IJ SOLN
INTRAMUSCULAR | Status: AC
  Filled 2015-09-01: qty 5

## 2015-09-01 MED ORDER — DIPHENHYDRAMINE HCL 50 MG/ML IJ SOLN: 25.0000 mg | Freq: Four times a day (QID) | INTRAMUSCULAR | Status: DC | PRN

## 2015-09-01 MED ORDER — EPHEDRINE SULFATE 50 MG/ML IJ SOLN
INTRAMUSCULAR | Status: AC
  Filled 2015-09-01: qty 1

## 2015-09-01 MED ORDER — PROMETHAZINE HCL 25 MG/ML IJ SOLN: 6.2500 mg | INTRAMUSCULAR | Status: DC | PRN

## 2015-09-01 MED ORDER — NEOSTIGMINE METHYLSULFATE 10 MG/10ML IV SOLN
INTRAVENOUS | Status: AC
  Filled 2015-09-01: qty 1

## 2015-09-01 MED ORDER — SODIUM CHLORIDE 0.9 % IJ SOLN
INTRAMUSCULAR | Status: AC
  Filled 2015-09-01: qty 10

## 2015-09-01 MED ORDER — ACETAMINOPHEN 650 MG RE SUPP: 650.0000 mg | Freq: Four times a day (QID) | RECTAL | Status: DC | PRN

## 2015-09-01 MED ORDER — ONDANSETRON 4 MG PO TBDP
4.0000 mg | ORAL_TABLET | Freq: Four times a day (QID) | ORAL | Status: DC | PRN
Start: 2015-09-01 — End: 2015-09-02

## 2015-09-01 MED ORDER — FENTANYL CITRATE (PF) 100 MCG/2ML IJ SOLN
INTRAMUSCULAR | Status: AC
  Filled 2015-09-01: qty 2

## 2015-09-01 MED ORDER — KETOROLAC TROMETHAMINE 30 MG/ML IJ SOLN
30.0000 mg | Freq: Once | INTRAMUSCULAR | Status: AC
  Administered 2015-09-01: 30 mg via INTRAVENOUS
  Filled 2015-09-01: qty 1

## 2015-09-01 MED ORDER — BUPIVACAINE HCL (PF) 0.5 % IJ SOLN
INTRAMUSCULAR | Status: AC
  Filled 2015-09-01: qty 30

## 2015-09-01 MED ORDER — ROCURONIUM BROMIDE 100 MG/10ML IV SOLN
INTRAVENOUS | Status: DC | PRN
  Administered 2015-09-01: 30 mg via INTRAVENOUS
  Administered 2015-09-01: 5 mg via INTRAVENOUS

## 2015-09-01 MED ORDER — LABETALOL HCL 5 MG/ML IV SOLN
INTRAVENOUS | Status: AC
  Filled 2015-09-01: qty 4

## 2015-09-01 MED ORDER — GLYCOPYRROLATE 0.2 MG/ML IJ SOLN
INTRAMUSCULAR | Status: AC
  Filled 2015-09-01: qty 3

## 2015-09-01 MED ORDER — PROPOFOL 10 MG/ML IV BOLUS
INTRAVENOUS | Status: AC
  Filled 2015-09-01: qty 20

## 2015-09-01 MED ORDER — FENTANYL CITRATE (PF) 100 MCG/2ML IJ SOLN
50.0000 ug | Freq: Once | INTRAMUSCULAR | Status: AC
  Administered 2015-09-01: 50 ug via INTRAVENOUS

## 2015-09-01 MED ORDER — SIMETHICONE 80 MG PO CHEW: 40.0000 mg | CHEWABLE_TABLET | Freq: Four times a day (QID) | ORAL | Status: DC | PRN

## 2015-09-01 MED ORDER — ACETAMINOPHEN 325 MG PO TABS
650.0000 mg | ORAL_TABLET | Freq: Four times a day (QID) | ORAL | Status: DC | PRN
Start: 2015-09-01 — End: 2015-09-02
  Administered 2015-09-01 – 2015-09-02 (×2): 650 mg via ORAL
  Filled 2015-09-01 (×2): qty 2

## 2015-09-01 MED ORDER — LACTATED RINGERS IV SOLN
INTRAVENOUS | Status: DC
  Administered 2015-09-01: 13:00:00 via INTRAVENOUS

## 2015-09-01 MED ORDER — HYDROMORPHONE HCL 1 MG/ML IJ SOLN
1.0000 mg | INTRAMUSCULAR | Status: DC | PRN
  Administered 2015-09-01 – 2015-09-02 (×2): 1 mg via INTRAVENOUS
  Filled 2015-09-01 (×2): qty 1

## 2015-09-01 MED ORDER — BUPIVACAINE HCL (PF) 0.5 % IJ SOLN
INTRAMUSCULAR | Status: DC | PRN
  Administered 2015-09-01: 10 mL

## 2015-09-01 MED ORDER — PROPOFOL 10 MG/ML IV BOLUS
INTRAVENOUS | Status: DC | PRN
  Administered 2015-09-01: 150 mg via INTRAVENOUS

## 2015-09-01 MED ORDER — MEPERIDINE HCL 50 MG/ML IJ SOLN: 6.2500 mg | INTRAMUSCULAR | Status: DC | PRN

## 2015-09-01 MED ORDER — LACTATED RINGERS IV SOLN
INTRAVENOUS | Status: DC
  Administered 2015-09-01: 1000 mL via INTRAVENOUS

## 2015-09-01 MED ORDER — FENTANYL CITRATE (PF) 100 MCG/2ML IJ SOLN
INTRAMUSCULAR | Status: DC | PRN
  Administered 2015-09-01: 100 ug via INTRAVENOUS
  Administered 2015-09-01 (×4): 50 ug via INTRAVENOUS

## 2015-09-01 MED ORDER — HYDROMORPHONE HCL 1 MG/ML IJ SOLN
0.2500 mg | INTRAMUSCULAR | Status: DC | PRN
  Administered 2015-09-01 (×2): 0.5 mg via INTRAVENOUS
  Filled 2015-09-01: qty 1

## 2015-09-01 MED ORDER — ONDANSETRON HCL 4 MG/2ML IJ SOLN: 4.0000 mg | Freq: Four times a day (QID) | INTRAMUSCULAR | Status: DC | PRN

## 2015-09-01 MED ORDER — SODIUM CHLORIDE 0.9 % IR SOLN
Status: DC | PRN
  Administered 2015-09-01: 1000 mL

## 2015-09-01 MED ORDER — POVIDONE-IODINE 10 % OINT PACKET
TOPICAL_OINTMENT | CUTANEOUS | Status: DC | PRN
  Administered 2015-09-01: 1 via TOPICAL

## 2015-09-01 MED ORDER — LIDOCAINE HCL 1 % IJ SOLN
INTRAMUSCULAR | Status: DC | PRN
  Administered 2015-09-01: 30 mg via INTRADERMAL

## 2015-09-01 MED ORDER — ENOXAPARIN SODIUM 40 MG/0.4ML ~~LOC~~ SOLN
40.0000 mg | SUBCUTANEOUS | Status: DC
  Administered 2015-09-02: 40 mg via SUBCUTANEOUS
  Filled 2015-09-01: qty 0.4

## 2015-09-01 MED ORDER — MIDAZOLAM HCL 5 MG/5ML IJ SOLN
INTRAMUSCULAR | Status: DC | PRN
  Administered 2015-09-01: 2 mg via INTRAVENOUS

## 2015-09-01 MED ORDER — GLYCOPYRROLATE 0.2 MG/ML IJ SOLN
INTRAMUSCULAR | Status: DC | PRN
Start: 2015-09-01 — End: 2015-09-01
  Administered 2015-09-01: 0.6 mg via INTRAVENOUS

## 2015-09-01 MED ORDER — MIDAZOLAM HCL 2 MG/2ML IJ SOLN
INTRAMUSCULAR | Status: AC
  Filled 2015-09-01: qty 2

## 2015-09-01 MED ORDER — LORAZEPAM 2 MG/ML IJ SOLN: 1.0000 mg | INTRAMUSCULAR | Status: DC | PRN

## 2015-09-01 SURGICAL SUPPLY — 50 items
APL SRG 38 LTWT LNG FL B (MISCELLANEOUS) ×1
APPLICATOR ARISTA FLEXITIP XL (MISCELLANEOUS) ×2 IMPLANT
APPLIER CLIP LAPSCP 10X32 DD (CLIP) ×3 IMPLANT
BAG HAMPER (MISCELLANEOUS) ×3 IMPLANT
BAG SPEC RTRVL LRG 6X4 10 (ENDOMECHANICALS) ×1
CHLORAPREP W/TINT 26ML (MISCELLANEOUS) ×3 IMPLANT
CLOTH BEACON ORANGE TIMEOUT ST (SAFETY) ×3 IMPLANT
COVER LIGHT HANDLE STERIS (MISCELLANEOUS) ×6 IMPLANT
DECANTER SPIKE VIAL GLASS SM (MISCELLANEOUS) ×3 IMPLANT
ELECT REM PT RETURN 9FT ADLT (ELECTROSURGICAL) ×3
ELECTRODE REM PT RTRN 9FT ADLT (ELECTROSURGICAL) ×1 IMPLANT
FILTER SMOKE EVAC LAPAROSHD (FILTER) ×3 IMPLANT
FORMALIN 10 PREFIL 120ML (MISCELLANEOUS) ×3 IMPLANT
GLOVE BIOGEL PI IND STRL 7.0 (GLOVE) ×1 IMPLANT
GLOVE BIOGEL PI IND STRL 7.5 (GLOVE) IMPLANT
GLOVE BIOGEL PI INDICATOR 7.0 (GLOVE) ×4
GLOVE BIOGEL PI INDICATOR 7.5 (GLOVE) ×2
GLOVE ECLIPSE 7.0 STRL STRAW (GLOVE) ×4 IMPLANT
GLOVE EXAM NITRILE MD LF STRL (GLOVE) ×2 IMPLANT
GLOVE SURG SS PI 7.5 STRL IVOR (GLOVE) ×3 IMPLANT
GOWN STRL REUS W/ TWL XL LVL3 (GOWN DISPOSABLE) ×1 IMPLANT
GOWN STRL REUS W/TWL LRG LVL3 (GOWN DISPOSABLE) ×8 IMPLANT
GOWN STRL REUS W/TWL XL LVL3 (GOWN DISPOSABLE) ×3
HEMOSTAT ARISTA ABSORB 3G PWDR (MISCELLANEOUS) ×2 IMPLANT
HEMOSTAT SNOW SURGICEL 2X4 (HEMOSTASIS) ×3 IMPLANT
INST SET LAPROSCOPIC AP (KITS) ×3 IMPLANT
IV NS IRRIG 3000ML ARTHROMATIC (IV SOLUTION) IMPLANT
KIT ROOM TURNOVER APOR (KITS) ×3 IMPLANT
MANIFOLD NEPTUNE II (INSTRUMENTS) ×3 IMPLANT
NDL INSUFFLATION 14GA 120MM (NEEDLE) ×1 IMPLANT
NEEDLE INSUFFLATION 14GA 120MM (NEEDLE) ×3 IMPLANT
NS IRRIG 1000ML POUR BTL (IV SOLUTION) ×3 IMPLANT
PACK LAP CHOLE LZT030E (CUSTOM PROCEDURE TRAY) ×3 IMPLANT
PAD ARMBOARD 7.5X6 YLW CONV (MISCELLANEOUS) ×3 IMPLANT
PENCIL HANDSWITCHING (ELECTRODE) ×2 IMPLANT
POUCH SPECIMEN RETRIEVAL 10MM (ENDOMECHANICALS) ×3 IMPLANT
SET BASIN LINEN APH (SET/KITS/TRAYS/PACK) ×3 IMPLANT
SET TUBE IRRIG SUCTION NO TIP (IRRIGATION / IRRIGATOR) IMPLANT
SLEEVE ENDOPATH XCEL 5M (ENDOMECHANICALS) ×3 IMPLANT
SPONGE GAUZE 2X2 8PLY STER LF (GAUZE/BANDAGES/DRESSINGS) ×4
SPONGE GAUZE 2X2 8PLY STRL LF (GAUZE/BANDAGES/DRESSINGS) ×8 IMPLANT
STAPLER VISISTAT (STAPLE) ×3 IMPLANT
SUT VICRYL 0 UR6 27IN ABS (SUTURE) ×5 IMPLANT
TAPE CLOTH SURG 4X10 WHT LF (GAUZE/BANDAGES/DRESSINGS) ×2 IMPLANT
TROCAR ENDO BLADELESS 11MM (ENDOMECHANICALS) ×3 IMPLANT
TROCAR XCEL NON-BLD 5MMX100MML (ENDOMECHANICALS) ×3 IMPLANT
TROCAR XCEL UNIV SLVE 11M 100M (ENDOMECHANICALS) ×3 IMPLANT
TUBING INSUFFLATION (TUBING) ×3 IMPLANT
WARMER LAPAROSCOPE (MISCELLANEOUS) ×3 IMPLANT
YANKAUER SUCT 12FT TUBE ARGYLE (SUCTIONS) ×3 IMPLANT

## 2015-09-01 NOTE — Op Note (Signed)
Patient:  [name]  DOB:  [birth year]  MRN:  [id number]   Preop Diagnosis:  Cholecystitis, cholelithiasis  Postop Diagnosis:  Same, hydrops of gallbladder  Procedure:  Laparoscopic cholecystectomy  Surgeon:  Aviva Signs, M.D.  Assistant: Tama High, M.D.  Anes:  Gen. endotracheal  Indications:  Patient is a 60 year old white male who presents with acute cholecystitis secondary to cholelithiasis. The risks and benefits of the procedure including bleeding, infection, hepatobiliary injury, the possibility of an open procedure were fully explained to the patient, who gave informed consent.  Procedure note:  The patient was placed the supine position. After induction of general endotracheal anesthesia, the abdomen was prepped and draped using the usual sterile technique with DuraPrep. Surgical site confirmation was performed.  A supraumbilical incision was made down to the fascia. A Veress needle was introduced into the abdominal cavity and confirmation of placement was done using the saline drop test. The abdomen was then insufflated to 16 mmHg pressure. An 11 mm trocar was introduced into the abdominal cavity under direct visualization without difficulty. The patient was placed in reverse Trendelenburg position and an additional 11 mm trocar was placed the epigastric region and 5 mm trochars were placed the right upper quadrant and right flank regions. The liver was inspected and noted to be within normal limits.  The gallbladder was noted to be significantly distended with edematous wall. An incision was made along the fundus of the gallbladder and hydrops of the gallbladder was found. The gallbladder was decompressed with suction in order to facilitate exposure. The gallbladder was retracted in a dynamic fashion in order to provide a critical view of the triangle of Calot. The cystic duct was first identified. Its juncture to the infundibulum was fully identified. Endoclips were placed  proximally distally on the cystic duct, and the cystic duct was divided. This was likewise done cystic artery. The gallbladder was freed away from the gallbladder fossa using Bovie electrocautery. The gallbladder was delivered to the epigastric trocar site using an Endo Catch bag. The gallbladder fossa was inspected and no abnormal bleeding or bile leakage was noted. Surgicel was placed the gallbladder fossa. All fluid and air were then evacuated from the abdominal cavity prior to the removal of the trochars.  All wounds were irrigated with normal saline. All wounds were injected with 0.5% Sensorcaine. The supraumbilical fascia as well as epigastric fascia reapproximated using 0 Vicryl interrupted sutures. All skin incisions were well using staples. Betadine ointment and dry sterile dressings were applied.  All tape and needle counts were correct at the end of the procedure. Patient was extubated in the operating room and transferred to PACU in stable condition.  Complications:  None  EBL:  20 mL  Specimen:  Gallbladder

## 2015-09-01 NOTE — Transfer of Care (Signed)
Immediate Anesthesia Transfer of Care Note  Patient: [name]  Procedure(s) Performed: Procedure(s): LAPAROSCOPIC CHOLECYSTECTOMY (N/A)  Patient Location: PACU  Anesthesia Type:General  Level of Consciousness: awake and patient cooperative  Airway & Oxygen Therapy: Patient Spontanous Breathing and Patient connected to face mask oxygen  Post-op Assessment: Report given to RN, Post -op Vital signs reviewed and stable and Patient moving all extremities  Post vital signs: Reviewed and stable  Last Vitals:  Filed Vitals:   09/01/15 1445 09/01/15 1450  BP: 155/100   Pulse:    Temp:    Resp: 16 17    Last Pain:  Filed Vitals:   09/01/15 1450  PainSc: 6       Patients Stated Pain Goal: 0 ([id number])  Complications: No apparent anesthesia complications

## 2015-09-01 NOTE — Anesthesia Procedure Notes (Signed)
Procedure Name: Intubation Date/Time: 09/01/2015 3:03 PM Performed by: Charmaine Downs Pre-anesthesia Checklist: Emergency Drugs available, Patient identified, Suction available and Patient being monitored Patient Re-evaluated:Patient Re-evaluated prior to inductionOxygen Delivery Method: Circle system utilized Preoxygenation: Pre-oxygenation with 100% oxygen Intubation Type: IV induction Ventilation: Mask ventilation without difficulty and Oral airway inserted - appropriate to patient size Laryngoscope Size: Mac and 3 Grade View: Grade I Tube type: Oral Tube size: 8.0 mm Number of attempts: 1 Airway Equipment and Method: Stylet and Oral airway Placement Confirmation: ETT inserted through vocal cords under direct vision,  positive ETCO2 and breath sounds checked- equal and bilateral Secured at: 22 cm Tube secured with: Tape Dental Injury: Teeth and Oropharynx as per pre-operative assessment

## 2015-09-01 NOTE — Progress Notes (Signed)
Patient Demographics:    [name], is a 60 y.o. male, DOB - 04-28-1955, [id number]  Admit date - 08/29/2015   Admitting Physician Orvan Falconer, MD  Outpatient Primary MD for the patient is No PCP Per Patient  LOS - 2   Chief Complaint  Patient presents with  . Abdominal Pain        Subjective:    [name] today has no fevers,   No chest pain,  Patient continues to have recurrent emesis and nausea, WBC trending up, right upper quadrant abdominal pain persist   Assessment  & Plan :    Principal Problem:   Acute calculous cholecystitis Active Problems:   Nephrolithiasis   HTN (hypertension)   Cholelithiasis with acute cholecystitis with biliary obstruction  1)Acute Calculous Cholecystitis- CT abdomen and pelvic report noted, ultrasound of the abdomen report noted, surgical consult noted ,  for laparoscopic cholecystectomy on 09/01/2015, continue Zosyn. Hold Lovenox, Persistent emesis and elevated WBC noted,  2)HTN- stable, c/n amlodipine  Code Status : full code   Disposition Plan  : Depending on outcome of laparoscopic cholecystectomy on 09/01/2015  Consults  :  Gen. Surgery   DVT Prophylaxis  :   SCDs   Lab Results  Component Value Date   PLT 187 09/01/2015    Inpatient Medications  Scheduled Meds: . [MAR Hold] amLODipine  10 mg Oral Daily  . [MAR Hold] pantoprazole  40 mg Oral Q1200  . [MAR Hold] piperacillin-tazobactam  3.375 g Intravenous Q8H   Continuous Infusions: . dextrose 5 % and 0.9 % NaCl with KCl 20 mEq/L 100 mL/hr at 09/01/15 0003  . lactated ringers 50 mL/hr at 09/01/15 1305   PRN Meds:.[MAR Hold] acetaminophen, [MAR Hold] hydrALAZINE, [MAR Hold]  HYDROmorphone (DILAUDID) injection, [MAR Hold] ondansetron **OR** [MAR Hold] ondansetron (ZOFRAN) IV    Anti-infectives    Start     Dose/Rate Route Frequency Ordered Stop   08/30/15 0415  [MAR Hold]   piperacillin-tazobactam (ZOSYN) IVPB 3.375 g     (MAR Hold since 09/01/15 1253)   3.375 g 12.5 mL/hr over 240 Minutes Intravenous Every 8 hours 08/30/15 0404     08/30/15 0215  Ampicillin-Sulbactam (UNASYN) 3 g in sodium chloride 0.9 % 100 mL IVPB     3 g 100 mL/hr over 60 Minutes Intravenous  Once 08/30/15 0213 08/30/15 0347        Objective:   Filed Vitals:   09/01/15 1415 09/01/15 1420 09/01/15 1425 09/01/15 1430  BP: 154/96 158/98 158/99 156/101  Pulse:      Temp:      TempSrc:      Resp: 14 16 22 26   Height:      Weight:      SpO2: 96% 97% 97% 96%    Wt Readings from Last 3 Encounters:  09/01/15 95.255 kg (210 lb)  06/02/14 83.915 kg (185 lb)  04/10/14 81.647 kg (180 lb)     Intake/Output Summary (Last 24 hours) at 09/01/15 1433 Last data filed at 09/01/15 1400  Gross per 24 hour  Intake 1477.5 ml  Output    500 ml  Net  977.5 ml     Physical Exam  Gen:- Awake Alert,  In no apparent distress  HEENT:-  [place].AT, No sclera icterus Neck-Supple Neck,No JVD,.  Lungs-  CTAB  CV- S1, S2 normal Abd-  +ve B.Sounds, Abd Soft, epigastric and right upper quadrant tenderness noted no CVA area tenderness   Extremity/Skin:- No  edema,       Data Review:   Micro Results No results found for this or any previous visit (from the past 240 hour(s)).  Radiology Reports Dg Chest 2 View  08/30/2015  CLINICAL DATA:  Acute onset of central abdominal pain and nausea. Initial encounter. EXAM: CHEST  2 VIEW COMPARISON:  Chest radiograph performed 06/02/2014 FINDINGS: The lungs are well-aerated and clear. There is no evidence of focal opacification, pleural effusion or pneumothorax. The heart is normal in size; the mediastinal contour is within normal limits. No acute osseous abnormalities are seen. IMPRESSION: No acute cardiopulmonary process seen. Electronically Signed   By: Garald Balding M.D.   On: 08/30/2015 01:10   Ct Abdomen Pelvis W Contrast  08/30/2015  CLINICAL DATA:   60 year old male with abdominal pain EXAM: CT ABDOMEN AND PELVIS WITH CONTRAST TECHNIQUE: Multidetector CT imaging of the abdomen and pelvis was performed using the standard protocol following bolus administration of intravenous contrast. CONTRAST:  12mL ISOVUE-300 IOPAMIDOL (ISOVUE-300) INJECTION 61% COMPARISON:  CT dated 07/02/2006 FINDINGS: Minimal bibasilar atelectatic changes of the lungs. The visualized lung bases are otherwise clear. No intra-abdominal free air or free fluid. Probable mild fatty infiltration of the liver. The gallbladder is mildly distended. There is enhancement of the gallbladder mucosa with haziness of the gallbladder wall. Small pericholecystic fluid noted. No definite calcified stone identified. 80 noncalcified obstructing stone at the neck of the gallbladder or cystic duct is not excluded. Ultrasound is recommended for better evaluation of the gallbladder. The pancreas, spleen, adrenal glands appear unremarkable. Bilateral renal hypodense lesions measuring up to 18 mm in the upper pole of the left kidney most compatible with cysts. Small nonobstructing left renal inferior pole adjacent on with a combined diameter of 6 mm. There is no hydronephrosis on either side. The visualized ureters, the urinary bladder, the prostate, and seminal vesicles are grossly unremarkable. There is sigmoid diverticulosis without active inflammatory changes. Moderate stool noted throughout the colon. There is no evidence of small-bowel obstruction or active inflammation. Small hiatal hernia noted. Normal appendix. The abdominal aorta and IVC appear unremarkable. No portal venous gas identified. There is no adenopathy. There is a small fat containing umbilical hernia. Partially visualized small bilateral testicular hydroceles. Ultrasound may provide better evaluation of scrotum and testicles. There is mild degenerative changes of the spine. No acute fracture. IMPRESSION: Inflammatory changes of the gallbladder  most compatible with acute cholecystitis. No calcified gallstone identified. However a noncalcified stone at the neck of the gallbladder or proximal cystic duct is suspected. Ultrasound is recommended for better evaluation of the gallbladder. Small nonobstructing left renal calculus.  No hydronephrosis. Sigmoid diverticulosis. No evidence of bowel obstruction or active inflammation. Normal appendix. Electronically Signed   By: Anner Crete M.D.   On: 08/30/2015 01:16   US Abdomen Limited Ruq  08/30/2015  CLINICAL DATA:  Postprandial upper abdominal pain for 1 day, possible cholecystitis EXAM: US ABDOMEN LIMITED - RIGHT UPPER QUADRANT COMPARISON:  CT scan 08/30/2015 FINDINGS: Gallbladder: Mobile gallstones are noted within gallbladder. There is thickening of gallbladder wall up to 5 mm. Pericholecystic fluid is noted. Early cholecystitis cannot be excluded. Clinical correlation is necessary. Largest gallstone measures 1.4 cm. Common bile duct: Diameter: 6 mm in diameter within normal limits. Liver: No focal  lesion identified. There is mild increased echogenicity of the liver suspicious for fatty infiltration. IMPRESSION: Mobile gallstones are noted within gallbladder. There is thickening of gallbladder wall up to 5 mm. Pericholecystic fluid is noted. Acute cholecystitis cannot be excluded. Clinical correlation is necessary. Largest gallstone measures 1.4 cm. Normal CBD. Mild fatty infiltration of the liver pre Electronically Signed   By: Lahoma Crocker M.D.   On: 08/30/2015 11:31     CBC  Recent Labs Lab 08/29/15 2223 08/31/15 0559 09/01/15 0453  WBC 11.7* 10.1 12.6*  HGB 16.8 14.8 15.8  HCT 48.6 43.9 47.1  PLT 203 162 187  MCV 91.0 92.8 91.6  MCH 31.5 31.3 30.7  MCHC 34.6 33.7 33.5  RDW 14.0 13.8 13.5  LYMPHSABS 1.1  --  0.9  MONOABS 1.3*  --  1.7*  EOSABS 0.1  --  0.0  BASOSABS 0.0  --  0.0    Chemistries   Recent Labs Lab 08/29/15 2223 08/31/15 0559 09/01/15 0453  NA 135 132* 132*   K 3.8 3.5 4.1  CL 104 101 101  CO2 26 27 26   GLUCOSE 99 101* 129*  BUN 11 13 11   CREATININE 1.14 1.25* 1.18  CALCIUM 8.6* 8.2* 8.2*  MG  --   --  1.9  AST 21 32 18  ALT 27 43 36  ALKPHOS 51 42 44  BILITOT 0.9 1.7* 1.3*   ------------------------------------------------------------------------------------------------------------------  Recent Labs  09/01/15 0453  TRIG 55    No results found for: HGBA1C ------------------------------------------------------------------------------------------------------------------ No results for input(s): TSH, T4TOTAL, T3FREE, THYROIDAB in the last 72 hours.  Invalid input(s): FREET3 ------------------------------------------------------------------------------------------------------------------ No results for input(s): VITAMINB12, FOLATE, FERRITIN, TIBC, IRON, RETICCTPCT in the last 72 hours.  Coagulation profile No results for input(s): INR, PROTIME in the last 168 hours.  No results for input(s): DDIMER in the last 72 hours.  Cardiac Enzymes No results for input(s): CKMB, TROPONINI, MYOGLOBIN in the last 168 hours.  Invalid input(s): CK ------------------------------------------------------------------------------------------------------------------ No results found for: BNP   Callahan Peddie M.D on 09/01/2015 at 2:33 PM  Between 7am to 7pm - Pager - [phone]  After 7pm go to [email or web address] - password [email or web address]  [facility] -  Office  [phone]  Dragon dictation system was used to create this note, attempts have been made to correct errors, however presence of uncorrected errors is not a reflection quality of care provided

## 2015-09-01 NOTE — Care Management Note (Signed)
Case Management Note  Patient Details  Name: BIGE BARRIENTOS MRN: [id number] Date of Birth: [birth year]  Subjective/Objective: Patient admitted with acute cholecystitis.  He is from home with his wife. He is independent with ADL's. He is self-employed and reports having insurance with [employer]. He reports having Dr. Kerri Perches and Dr. Orson Ape as his PCP, who have both retired. List given of providers accepting new patients. Patient reports no issues affording medications.   Action/Plan: No CM needs identified.   Expected Discharge Date:        09/02/2015          Expected Discharge Plan:  Home/Self Care  In-House Referral:  NA  Discharge planning Services  CM Consult  Post Acute Care Choice:  NA Choice offered to:  NA  DME Arranged:    DME Agency:     HH Arranged:    HH Agency:     Status of Service:  Completed, signed off  If discussed at [employer] of Stay Meetings, dates discussed:    Additional Comments:  Karenna Romanoff, Chauncey Reading, RN 09/01/2015, 11:13 AM

## 2015-09-01 NOTE — Anesthesia Preprocedure Evaluation (Addendum)
Anesthesia Evaluation  Patient identified by MRN, date of birth, ID band Patient awake    Reviewed: Allergy & Precautions, NPO status , Patient's Chart, lab work & pertinent test results  Airway Mallampati: II  TM Distance: >3 FB Neck ROM: Full    Dental  (+) Missing,    Pulmonary neg pulmonary ROS,    breath sounds clear to auscultation       Cardiovascular hypertension,  Rhythm:Regular Rate:Normal     Neuro/Psych negative neurological ROS  negative psych ROS   GI/Hepatic negative GI ROS, Neg liver ROS,   Endo/Other  negative endocrine ROS  Renal/GU Renal InsufficiencyRenal disease  negative genitourinary   Musculoskeletal negative musculoskeletal ROS (+)   Abdominal   Peds negative pediatric ROS (+)  Hematology negative hematology ROS (+)   Anesthesia Other Findings   Reproductive/Obstetrics negative OB ROS                            Lab Results  Component Value Date   WBC 12.6* 09/01/2015   HGB 15.8 09/01/2015   HCT 47.1 09/01/2015   MCV 91.6 09/01/2015   PLT 187 09/01/2015   Lab Results  Component Value Date   CREATININE 1.18 09/01/2015   BUN 11 09/01/2015   NA 132* 09/01/2015   K 4.1 09/01/2015   CL 101 09/01/2015   CO2 26 09/01/2015   No results found for: INR, PROTIME  08/2015 EKG: normal sinus rhythm.   Anesthesia Physical Anesthesia Plan  ASA: II  Anesthesia Plan: General   Post-op Pain Management:    Induction: Intravenous  Airway Management Planned: Oral ETT  Additional Equipment:   Intra-op Plan:   Post-operative Plan: Extubation in OR  Informed Consent: I have reviewed the patients History and Physical, chart, labs and discussed the procedure including the risks, benefits and alternatives for the proposed anesthesia with the patient or authorized representative who has indicated his/her understanding and acceptance.   Dental advisory  given  Plan Discussed with: CRNA  Anesthesia Plan Comments:         Anesthesia Quick Evaluation

## 2015-09-01 NOTE — Anesthesia Postprocedure Evaluation (Signed)
Anesthesia Post Note  Patient: [name]  Procedure(s) Performed: Procedure(s) (LRB): LAPAROSCOPIC CHOLECYSTECTOMY (N/A)  Patient location during evaluation: PACU Anesthesia Type: General Level of consciousness: awake and alert, oriented and patient cooperative Pain management: pain level controlled Vital Signs Assessment: post-procedure vital signs reviewed and stable Respiratory status: spontaneous breathing, nonlabored ventilation and respiratory function stable Cardiovascular status: blood pressure returned to baseline and stable Postop Assessment: no signs of nausea or vomiting Anesthetic complications: no    Last Vitals:  Filed Vitals:   09/01/15 1605 09/01/15 1615  BP: 184/101 190/106  Pulse: 79 70  Temp: 37.3 C   Resp: 30 20    Last Pain:  Filed Vitals:   09/01/15 1617  PainSc: 6                  Keon Benscoter J

## 2015-09-02 ENCOUNTER — Encounter (HOSPITAL_COMMUNITY): Payer: Self-pay | Admitting: General Surgery

## 2015-09-02 LAB — CBC
HEMATOCRIT: 41 % (ref 39.0–52.0)
HEMOGLOBIN: 13.3 g/dL (ref 13.0–17.0)
MCH: 30 pg (ref 26.0–34.0)
MCHC: 32.4 g/dL (ref 30.0–36.0)
MCV: 92.6 fL (ref 78.0–100.0)
Platelets: 187 10*3/uL (ref 150–400)
RBC: 4.43 MIL/uL (ref 4.22–5.81)
RDW: 13.5 % (ref 11.5–15.5)
WBC: 12.9 10*3/uL — ABNORMAL HIGH (ref 4.0–10.5)

## 2015-09-02 LAB — HEPATIC FUNCTION PANEL
ALK PHOS: 40 U/L (ref 38–126)
ALT: 53 U/L (ref 17–63)
AST: 54 U/L — ABNORMAL HIGH (ref 15–41)
Albumin: 2.8 g/dL — ABNORMAL LOW (ref 3.5–5.0)
BILIRUBIN DIRECT: 0.4 mg/dL (ref 0.1–0.5)
BILIRUBIN INDIRECT: 1.2 mg/dL — AB (ref 0.3–0.9)
BILIRUBIN TOTAL: 1.6 mg/dL — AB (ref 0.3–1.2)
Total Protein: 6.2 g/dL — ABNORMAL LOW (ref 6.5–8.1)

## 2015-09-02 LAB — BASIC METABOLIC PANEL
ANION GAP: 6 (ref 5–15)
BUN: 14 mg/dL (ref 6–20)
CALCIUM: 8 mg/dL — AB (ref 8.9–10.3)
CHLORIDE: 102 mmol/L (ref 101–111)
CO2: 26 mmol/L (ref 22–32)
Creatinine, Ser: 1.24 mg/dL (ref 0.61–1.24)
GFR calc non Af Amer: >60 mL/min (ref >60)
GLUCOSE: 127 mg/dL — AB (ref 65–99)
Potassium: 3.9 mmol/L (ref 3.5–5.1)
Sodium: 134 mmol/L — ABNORMAL LOW (ref 135–145)

## 2015-09-02 MED ORDER — SODIUM CHLORIDE 0.9 % IV BOLUS (SEPSIS)
500.0000 mL | Freq: Once | INTRAVENOUS | Status: AC
  Administered 2015-09-02: 500 mL via INTRAVENOUS

## 2015-09-02 MED ORDER — OXYCODONE-ACETAMINOPHEN 7.5-325 MG PO TABS: 1.0000 | ORAL_TABLET | ORAL | Status: DC | PRN

## 2015-09-02 MED ORDER — AMLODIPINE BESYLATE 10 MG PO TABS: 5.0000 mg | ORAL_TABLET | Freq: Every day | ORAL | Status: DC

## 2015-09-02 NOTE — Care Management Note (Signed)
Case Management Note  Patient Details  Name: BRAIDAN EDWARDSON MRN: [id number] Date of Birth: [birth year]   Expected Discharge Date:          09/02/2015        Expected Discharge Plan:  Home/Self Care  In-House Referral:  NA  Discharge planning Services  CM Consult  Post Acute Care Choice:  NA Choice offered to:  NA  DME Arranged:    DME Agency:     HH Arranged:    [place] Agency:     Status of Service:  Completed, signed off  If discussed at [employer] of Stay Meetings, dates discussed:    Additional Comments:Patient discharging home with self care. No CM needs.  Azucena Dart, Chauncey Reading, RN 09/02/2015, 10:39 AM

## 2015-09-02 NOTE — Care Management Note (Deleted)
Case Management Note  Patient Details  Name: [name] MRN: [id number] Date of Birth: 1955-07-21  :   Expected Discharge Date:       09/02/2015           Expected Discharge Plan:  Home/Self Care  In-House Referral:  NA  Discharge planning Services  CM Consult  Post Acute Care Choice:  NA Choice offered to:  NA  DME Arranged:    DME Agency:     HH Arranged:    [place] Agency:     Status of Service:  Completed, signed off  If discussed at [employer] of Stay Meetings, dates discussed:    Additional Comments: Patient discharging home with self care. No CM needs.   Tupac Jeffus, Chauncey Reading, RN 09/02/2015, 10:35 AM

## 2015-09-02 NOTE — Discharge Instructions (Signed)
Laparoscopic Cholecystectomy, Care After °Refer to this sheet in the next few weeks. These instructions provide you with information about caring for yourself after your procedure. Your health care provider may also give you more specific instructions. Your treatment has been planned according to current medical practices, but problems sometimes occur. Call your health care provider if you have any problems or questions after your procedure. °WHAT TO EXPECT AFTER THE PROCEDURE °After your procedure, it is common to have: °· Pain at your incision sites. You will be given pain medicines to control your pain. °· Mild nausea or vomiting. This should improve after the first 24 hours. °· Bloating and possible shoulder pain from the gas that was used during the procedure. This will improve after the first 24 hours. °HOME CARE INSTRUCTIONS °Incision Care °· Follow instructions from your health care provider about how to take care of your incisions. Make sure you: °¨ Wash your hands with soap and water before you change your bandage (dressing). If soap and water are not available, use hand sanitizer. °¨ Change your dressing as told by your health care provider. °¨ Leave stitches (sutures), skin glue, or adhesive strips in place. These skin closures may need to be in place for 2 weeks or longer. If adhesive strip edges start to loosen and curl up, you may trim the loose edges. Do not remove adhesive strips completely unless your health care provider tells you to do that. °· Do not take baths, swim, or use a hot tub until your health care provider approves. Ask your health care provider if you can take showers. You may only be allowed to take sponge baths for bathing. °General Instructions °· Take over-the-counter and prescription medicines only as told by your health care provider. °· Do not drive or operate heavy machinery while taking prescription pain medicine. °· Return to your normal diet as told by your health care  provider. °· Do not lift anything that is heavier than 10 lb (4.5 kg). °· Do not play contact sports for one week or until your health care provider approves. °SEEK MEDICAL CARE IF:  °· You have redness, swelling, or pain at the site of your incision. °· You have fluid, blood, or pus coming from your incision. °· You notice a bad smell coming from your incision area. °· Your surgical incisions break open. °· You have a fever. °SEEK IMMEDIATE MEDICAL CARE IF: °· You develop a rash. °· You have difficulty breathing. °· You have chest pain. °· You have increasing pain in your shoulders (shoulder strap areas). °· You faint or have dizzy episodes while you are standing. °· You have severe pain in your abdomen. °· You have nausea or vomiting that lasts for more than one day. °  °This information is not intended to replace advice given to you by your health care provider. Make sure you discuss any questions you have with your health care provider. °  °Document Released: 02/22/2005 Document Revised: 11/13/2014 Document Reviewed: 10/04/2012 °Elsevier Interactive Patient Education ©2016 [employer]. ° °

## 2015-09-02 NOTE — Discharge Summary (Signed)
Physician Discharge Summary  Patient ID: [name] MRN: [id number] DOB/AGE: Jan 23, 1956 60 y.o.  Admit date: 08/29/2015 Discharge date: 09/02/2015  Admission Diagnoses:Acute cholecystitis, cholelithiasis  Discharge Diagnoses: Same Principal Problem:   Acute calculous cholecystitis Active Problems:   Nephrolithiasis   HTN (hypertension)   Cholelithiasis with acute cholecystitis with biliary obstruction   Discharged Condition: good  Hospital Course: Patient is a 60 year old white male who presented emergency room with worsening right upper quadrant abdominal pain. Workup revealed cholecystitis. Ultrasound confirmed acute cholecystitis with cholelithiasis. Surgery was consulted and the patient went to the operating room on 09/01/2015 and underwent laparoscopic cholecystectomy. He tolerated the procedure well. His postoperative course was unremarkable. His diet was advanced without difficulty. His liver enzyme tests are within normal limits postop. His blood pressure is under better control as the patient was not taking his Norvasc as previously prescribed. The patient is being discharged home on 09/02/2015 in good and improving condition.  Treatments: surgery: Laparoscopic cholecystectomy on 09/01/2015  Discharge Exam: Blood pressure 108/62, pulse 71, temperature 98.8 F (37.1 C), temperature source Oral, resp. rate 16, height 5\' 7"  (1.702 m), weight 95.255 kg (210 lb), SpO2 93 %. General appearance: alert, cooperative and no distress Resp: clear to auscultation bilaterally Cardio: regular rate and rhythm, S1, S2 normal, no murmur, click, rub or gallop GI: Soft. Dressings dry and intact.  Disposition: 01-Home or Self Care     Medication List    TAKE these medications        acetaminophen 500 MG tablet  Commonly known as:  TYLENOL  Take 500-1,500 mg by mouth every 6 (six) hours as needed for mild pain or moderate pain.     amLODipine 10 MG tablet  Commonly known as:   NORVASC  Take 0.5 tablets (5 mg total) by mouth daily.     ANTACID 200-200-20 MG/5ML suspension  Generic drug:  alum & mag hydroxide-simeth  Take 30 mLs by mouth every 6 (six) hours as needed for indigestion or heartburn.     bismuth subsalicylate 99991111 [id number] suspension  Commonly known as:  PEPTO BISMOL  Take 30 mLs by mouth every 4 (four) hours as needed for indigestion or diarrhea or loose stools.     oxyCODONE-acetaminophen 7.5-325 MG tablet  Commonly known as:  PERCOCET  Take 1-2 tablets by mouth every 4 (four) hours as needed.           Follow-up Information    Follow up with Jamesetta So, MD. Schedule an appointment as soon as possible for a visit on 09/11/2015.   Specialty:  General Surgery   Contact information:   [address] [place] [id number] [phone]       Signed: Aviva Signs A 09/02/2015, 7:22 AM

## 2015-09-02 NOTE — Progress Notes (Signed)
Pt's temp 102.9.  Tylenol 650mg  given and Dr notified.  New orders given by Dr.

## 2015-09-02 NOTE — Progress Notes (Signed)
Patient discharged with instructions given on medications,and follow up visits,patient verbalized understanding. Prescriptions sent with patient. Staff to accompany patient to awaiting vehicle.

## 2015-09-07 LAB — CULTURE, BLOOD (ROUTINE X 2)
Culture: NO GROWTH
Culture: NO GROWTH

## 2016-03-22 ENCOUNTER — Emergency Department (HOSPITAL_COMMUNITY): Payer: Self-pay

## 2016-03-22 ENCOUNTER — Emergency Department (HOSPITAL_COMMUNITY)
Admission: EM | Admit: 2016-03-22 | Discharge: 2016-03-22 | Disposition: A | Discharge status: Home / Self Care | Payer: Self-pay | Attending: Emergency Medicine | Admitting: Emergency Medicine

## 2016-03-22 ENCOUNTER — Encounter (HOSPITAL_COMMUNITY): Payer: Self-pay

## 2016-03-22 DIAGNOSIS — Z791 Long term (current) use of non-steroidal anti-inflammatories (NSAID): Secondary | ICD-10-CM | POA: Insufficient documentation

## 2016-03-22 DIAGNOSIS — I10 Essential (primary) hypertension: Secondary | ICD-10-CM | POA: Insufficient documentation

## 2016-03-22 DIAGNOSIS — Z79899 Other long term (current) drug therapy: Secondary | ICD-10-CM | POA: Insufficient documentation

## 2016-03-22 DIAGNOSIS — M545 Low back pain, unspecified: Secondary | ICD-10-CM

## 2016-03-22 DIAGNOSIS — R109 Unspecified abdominal pain: Secondary | ICD-10-CM | POA: Insufficient documentation

## 2016-03-22 LAB — COMPREHENSIVE METABOLIC PANEL
ALT: 25 U/L (ref 17–63)
AST: 23 U/L (ref 15–41)
Albumin: 4.1 g/dL (ref 3.5–5.0)
Alkaline Phosphatase: 54 U/L (ref 38–126)
Anion gap: 6 (ref 5–15)
BUN: 15 mg/dL (ref 6–20)
CO2: 27 mmol/L (ref 22–32)
Calcium: 9.3 mg/dL (ref 8.9–10.3)
Chloride: 105 mmol/L (ref 101–111)
Creatinine, Ser: 1.25 mg/dL — ABNORMAL HIGH (ref 0.61–1.24)
GFR calc Af Amer: >60 mL/min (ref >60)
GFR calc non Af Amer: >60 mL/min (ref >60)
Glucose, Bld: 95 mg/dL (ref 65–99)
Potassium: 4.1 mmol/L (ref 3.5–5.1)
Sodium: 138 mmol/L (ref 135–145)
Total Bilirubin: 0.5 mg/dL (ref 0.3–1.2)
Total Protein: 7.4 g/dL (ref 6.5–8.1)

## 2016-03-22 LAB — URINALYSIS, ROUTINE W REFLEX MICROSCOPIC
Bilirubin Urine: NEGATIVE
Glucose, UA: NEGATIVE mg/dL
Hgb urine dipstick: NEGATIVE
Ketones, ur: NEGATIVE mg/dL
Leukocytes, UA: NEGATIVE
Nitrite: NEGATIVE
Protein, ur: NEGATIVE mg/dL
Specific Gravity, Urine: 1.018 (ref 1.005–1.030)
pH: 5 (ref 5.0–8.0)

## 2016-03-22 LAB — CBC WITH DIFFERENTIAL/PLATELET
Basophils Absolute: 0 10*3/uL (ref 0.0–0.1)
Basophils Relative: 1 %
Eosinophils Absolute: 0.1 10*3/uL (ref 0.0–0.7)
Eosinophils Relative: 2 %
HCT: 49.9 % (ref 39.0–52.0)
Hemoglobin: 16.7 g/dL (ref 13.0–17.0)
Lymphocytes Relative: 28 %
Lymphs Abs: 2 10*3/uL (ref 0.7–4.0)
MCH: 31 pg (ref 26.0–34.0)
MCHC: 33.5 g/dL (ref 30.0–36.0)
MCV: 92.6 fL (ref 78.0–100.0)
Monocytes Absolute: 0.9 10*3/uL (ref 0.1–1.0)
Monocytes Relative: 13 %
Neutro Abs: 4.1 10*3/uL (ref 1.7–7.7)
Neutrophils Relative %: 56 %
Platelets: 235 10*3/uL (ref 150–400)
RBC: 5.39 MIL/uL (ref 4.22–5.81)
RDW: 13.6 % (ref 11.5–15.5)
WBC: 7.2 10*3/uL (ref 4.0–10.5)

## 2016-03-22 LAB — LIPASE, BLOOD: Lipase: 23 U/L (ref 11–51)

## 2016-03-22 MED ORDER — KETOROLAC TROMETHAMINE 30 MG/ML IJ SOLN
15.0000 mg | Freq: Once | INTRAMUSCULAR | Status: AC
  Administered 2016-03-22: 15 mg via INTRAVENOUS
  Filled 2016-03-22: qty 1

## 2016-03-22 MED ORDER — SODIUM CHLORIDE 0.9 % IV BOLUS (SEPSIS)
1000.0000 mL | Freq: Once | INTRAVENOUS | Status: AC
  Administered 2016-03-22: 1000 mL via INTRAVENOUS

## 2016-03-22 MED ORDER — TRAMADOL HCL 50 MG PO TABS: 50.0000 mg | ORAL_TABLET | Freq: Four times a day (QID) | ORAL | 0 refills | Status: DC | PRN

## 2016-03-22 MED ORDER — AMLODIPINE BESYLATE 5 MG PO TABS: 5.0000 mg | ORAL_TABLET | Freq: Every day | ORAL | 2 refills | Status: DC

## 2016-03-22 MED ORDER — IOPAMIDOL (ISOVUE-300) INJECTION 61%
INTRAVENOUS | Status: AC
  Filled 2016-03-22: qty 30

## 2016-03-22 MED ORDER — IOPAMIDOL (ISOVUE-300) INJECTION 61%
100.0000 mL | Freq: Once | INTRAVENOUS | Status: AC | PRN
  Administered 2016-03-22: 100 mL via INTRAVENOUS

## 2016-03-22 MED ORDER — ONDANSETRON HCL 4 MG/2ML IJ SOLN
4.0000 mg | Freq: Once | INTRAMUSCULAR | Status: AC
  Administered 2016-03-22: 4 mg via INTRAVENOUS
  Filled 2016-03-22: qty 2

## 2016-03-22 NOTE — ED Notes (Signed)
Patient states that he took his blood pressure medication yesterday, but has not taken it today.

## 2016-03-22 NOTE — ED Notes (Signed)
Pt ambulatory to waiting room. Pt verbalized understanding of discharge instructions.   

## 2016-03-22 NOTE — ED Triage Notes (Signed)
Patient states that he is having bilateral flank pain that started 3-4 days ago that radiates into his abdomen when he goes to the bathroom to urinate.  Denies blood in urine.  States that he is having frequency and that he has a history of kidney stones, but it does not feel the same as a kidney stone.

## 2016-04-04 NOTE — ED Provider Notes (Signed)
[facility] DEPT Provider Note   CSN: [id number] Arrival date & time: 03/22/16  1824     History   Chief Complaint Chief Complaint  Patient presents with  . Flank Pain    HPI [name] is a 61 y.o. male.  HPI  34yM with bilateral flank pain that started 3-4 days ago that radiates into his abdomen when he goes to the bathroom to urinate.  Denies blood in urine.  States that he is having frequency and that he has a history of kidney stones, but it does not feel the same as a kidney stone.Pressure chills. No nausea or vomiting. No diarrhea.   Past Medical History:  Diagnosis Date  . Hypertension   . Renal disorder    kidney stones 07-08    Patient Active Problem List   Diagnosis Date Noted  . Acute calculous cholecystitis 09/01/2015  . Cholelithiasis with acute cholecystitis with biliary obstruction 09/01/2015  . Acute acalculous cholecystitis 08/30/2015  . Nephrolithiasis 08/30/2015  . HTN (hypertension) 08/30/2015    Past Surgical History:  Procedure Laterality Date  . CHOLECYSTECTOMY N/A 09/01/2015   Procedure: LAPAROSCOPIC CHOLECYSTECTOMY;  Surgeon: Aviva Signs, MD;  Location: [facility];  Service: General;  Laterality: N/A;       Home Medications    Prior to Admission medications   Medication Sig Start Date End Date Taking? Authorizing Provider  amLODipine (NORVASC) 10 MG tablet Take 0.5 tablets (5 mg total) by mouth daily. 09/02/15  Yes Aviva Signs, MD  ibuprofen (ADVIL,MOTRIN) 200 MG tablet Take 200 mg by mouth every 6 (six) hours as needed for mild pain or moderate pain.   Yes Historical Provider, MD  naproxen sodium (ALEVE) 220 MG tablet Take 220 mg by mouth daily as needed.   Yes Historical Provider, MD  amLODipine (NORVASC) 5 MG tablet Take 1 tablet (5 mg total) by mouth daily. 03/22/16   Virgel Manifold, MD  traMADol (ULTRAM) 50 MG tablet Take 1 tablet (50 mg total) by mouth every 6 (six) hours as needed. 03/22/16   Virgel Manifold, MD    Family  History No family history on file.  Social History Social History  Substance Use Topics  . Smoking status: Never Smoker  . Smokeless tobacco: Never Used  . Alcohol use No     Allergies   Ciprofloxacin   Review of Systems Review of Systems  All systems reviewed and negative, other than as noted in HPI.   Physical Exam Updated Vital Signs BP 179/98   Pulse (!) 59   Temp 98 F (36.7 C) (Oral)   Resp 16   Ht 5\' 7"  (1.702 m)   Wt 190 lb (86.2 kg)   SpO2 96%   BMI 29.76 kg/m   Physical Exam  Constitutional: He appears well-developed and well-nourished. No distress.  HENT:  Head: Normocephalic and atraumatic.  Eyes: Conjunctivae are normal. Right eye exhibits no discharge. Left eye exhibits no discharge.  Neck: Neck supple.  Cardiovascular: Normal rate, regular rhythm and normal heart sounds.  Exam reveals no gallop and no friction rub.   No murmur heard. Pulmonary/Chest: Effort normal and breath sounds normal. No respiratory distress.  Abdominal: Soft. He exhibits no distension. There is no tenderness.  Musculoskeletal: He exhibits no edema or tenderness.  Neurological: He is alert.  Skin: Skin is warm and dry.  Psychiatric: He has a normal mood and affect. His behavior is normal. Thought content normal.  Nursing note and vitals reviewed.    ED Treatments /  Results  Labs (all labs ordered are listed, but only abnormal results are displayed) Labs Reviewed  COMPREHENSIVE METABOLIC PANEL - Abnormal; Notable for the following:       Result Value   Creatinine, Ser 1.25 (*)    All other components within normal limits  URINALYSIS, ROUTINE W REFLEX MICROSCOPIC  CBC WITH DIFFERENTIAL/PLATELET  LIPASE, BLOOD    EKG  EKG Interpretation None       Radiology No results found.   Ct Abdomen Pelvis W Contrast  Result Date: 03/22/2016 CLINICAL DATA:  Bilateral flank pain starting 3-4 days ago radiating into abdomen with urination. : CT ABDOMEN AND PELVIS WITH  CONTRAST TECHNIQUE: Multidetector CT imaging of the abdomen and pelvis was performed using the standard protocol following bolus administration of intravenous contrast. CONTRAST:  145mL ISOVUE-300 IOPAMIDOL (ISOVUE-300) INJECTION 61% COMPARISON:  None. FINDINGS: Lower chest: No acute abnormality. 5 mm pulmonary nodule within the left lower lobe. Hepatobiliary: Status post cholecystectomy. Liver slightly low in density suggesting fatty infiltration. No bile duct dilatation. Pancreas: Unremarkable. No pancreatic ductal dilatation or surrounding inflammatory changes. Spleen: Normal in size without focal abnormality. Adrenals/Urinary Tract: Adrenal glands appear normal. Small benign cysts within each kidney. 3 mm nonobstructing left renal stone. No hydronephrosis bilaterally. No perinephric fluid or inflammation. No ureteral or bladder calculi. Bladder appears normal. Stomach/Bowel: Bowel is normal in caliber. Fairly extensive diverticulosis of the sigmoid and descending colon without evidence of acute diverticulitis. Additional scattered diverticulosis throughout remainder of the colon. No bowel wall thickening or evidence of bowel wall inflammation seen. Appendix is normal. Stomach is unremarkable. Vascular/Lymphatic: Mild aortic atherosclerosis. No acute appearing vascular abnormality seen. No enlarged lymph nodes seen in the abdomen or pelvis. Reproductive: Unremarkable. Other: No free fluid or abscess collection identified. No free intraperitoneal air. Musculoskeletal: Mild degenerative change within the lumbar spine. No acute or suspicious osseous finding. Superficial soft tissues are unremarkable. IMPRESSION: 1. No acute findings within the abdomen or pelvis. 2. 3 mm nonobstructing left renal stone. No associated hydronephrosis. No ureteral or bladder calculi. No perinephric fluid or inflammation. Bladder appears normal. 3. Colonic diverticulosis without evidence of acute diverticulitis. No bowel obstruction or  evidence of bowel wall inflammation. 4. Probable mild fatty infiltration of the liver. This was also suggested on earlier abdomen ultrasound of 08/30/2015. 5. 5 mm pulmonary nodule within the left lower lobe. No follow-up needed if patient is low-risk. Non-contrast chest CT can be considered in 12 months if patient is high-risk. This recommendation follows the consensus statement: Guidelines for Management of Incidental Pulmonary Nodules Detected on CT Images: From the Fleischner Society 2017; Radiology 2017; 284:228-243. 6. Aortic atherosclerosis. Electronically Signed   By: Franki Cabot M.D.   On: 03/22/2016 21:34    Procedures Procedures (including critical care time)  Medications Ordered in ED Medications  sodium chloride 0.9 % bolus 1,000 mL (0 mLs Intravenous Stopped 03/22/16 2227)  ketorolac (TORADOL) 30 MG/ML injection 15 mg (15 mg Intravenous Given 03/22/16 2019)  ondansetron (ZOFRAN) injection 4 mg (4 mg Intravenous Given 03/22/16 2019)  iopamidol (ISOVUE-300) 61 % injection 100 mL (100 mLs Intravenous Contrast Given 03/22/16 2055)     Initial Impression / Assessment and Plan / ED Course  I have reviewed the triage vital signs and the nursing notes.  Pertinent labs & imaging results that were available during my care of the patient were reviewed by me and considered in my medical decision making (see chart for details).      Final Clinical Impressions(s) /  ED Diagnoses   Final diagnoses:  Abdominal pain, unspecified abdominal location  Acute low back pain without sciatica, unspecified back pain laterality    New Prescriptions Discharge Medication List as of 03/22/2016 10:04 PM    START taking these medications   Details  traMADol (ULTRAM) 50 MG tablet Take 1 tablet (50 mg total) by mouth every 6 (six) hours as needed., Starting Mon 03/22/2016, Print         Virgel Manifold, MD 04/04/16 2152

## 2017-05-26 ENCOUNTER — Encounter: Payer: Self-pay | Admitting: Gastroenterology

## 2017-08-21 IMAGING — CT CT ABD-PELV W/ CM
2 of 5 series · 16 of 46 positions shown, 18 images · IV contrast (iopamidol)
Comparison: CT dated 07/02/2006

CLINICAL DATA: 60-year-old male with abdominal pain

EXAM:
CT ABDOMEN AND PELVIS WITH CONTRAST
TECHNIQUE: Multidetector CT imaging of the abdomen and pelvis was performed
using the standard protocol following bolus administration of
intravenous contrast.
CONTRAST:  100mL PRC5GS-WMM IOPAMIDOL (PRC5GS-WMM) INJECTION 61%

[Series 2: routine abd pel with · axial · 0.78mm/px · z∈[+366,+820]mm · 13 of 105 slices shown, 15 images]
[im 7/105  soft-tissue]
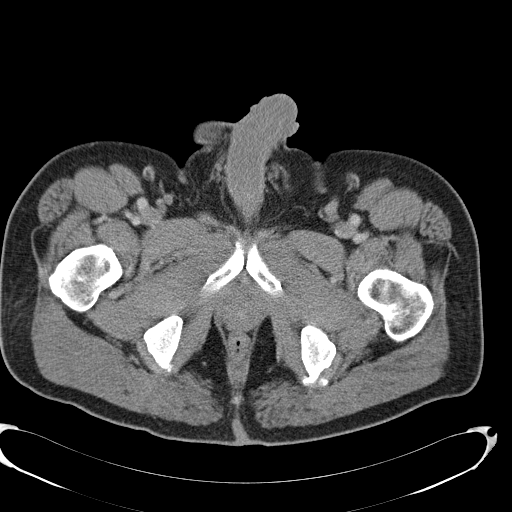
[im 7/105  bone]
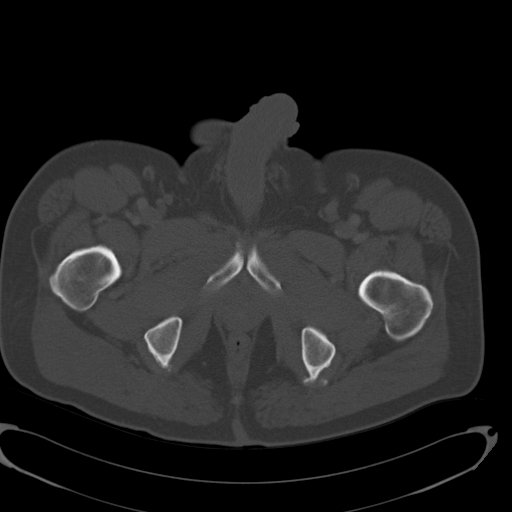
[im 14/105  soft-tissue]
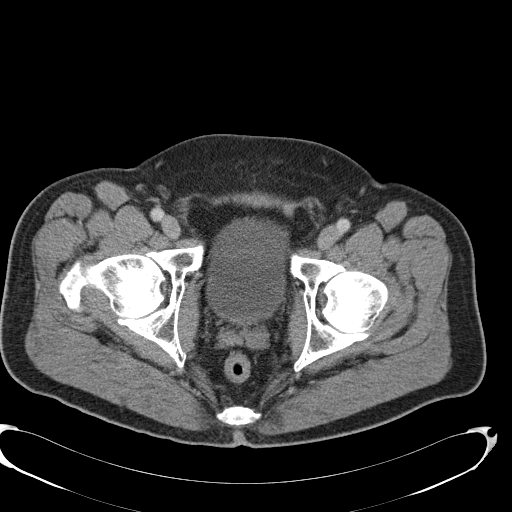
[im 20/105  soft-tissue]
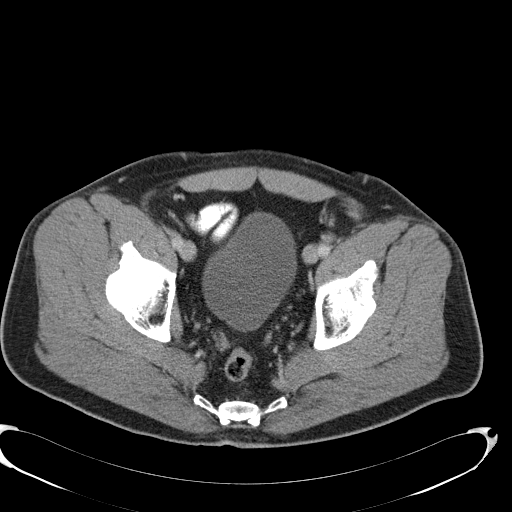
[im 33/105  soft-tissue]
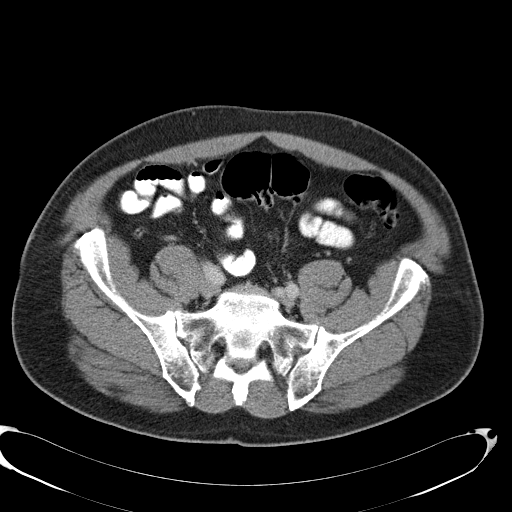
[im 40/105  soft-tissue]
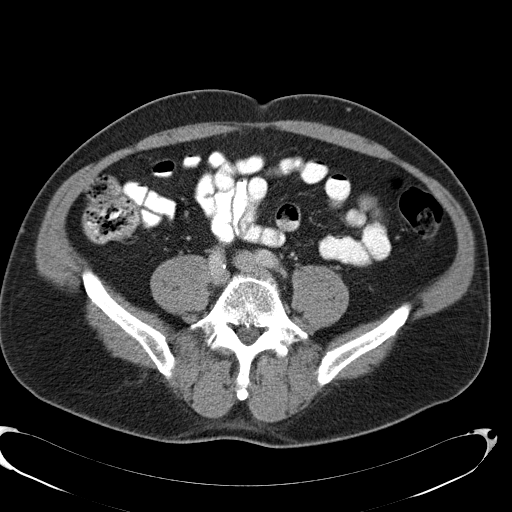
[im 46/105  soft-tissue]
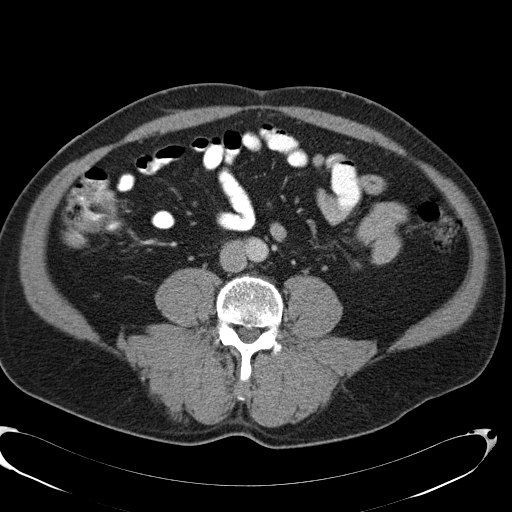
[im 53/105  soft-tissue]
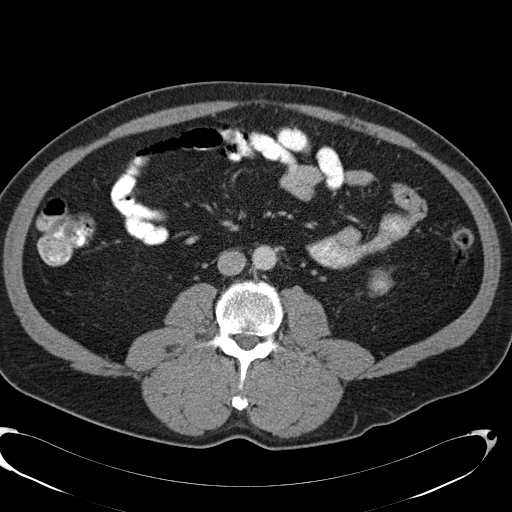
[im 59/105  soft-tissue]
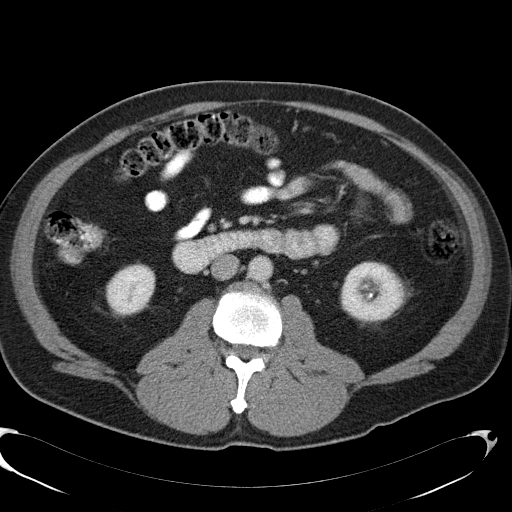
[im 66/105  soft-tissue]
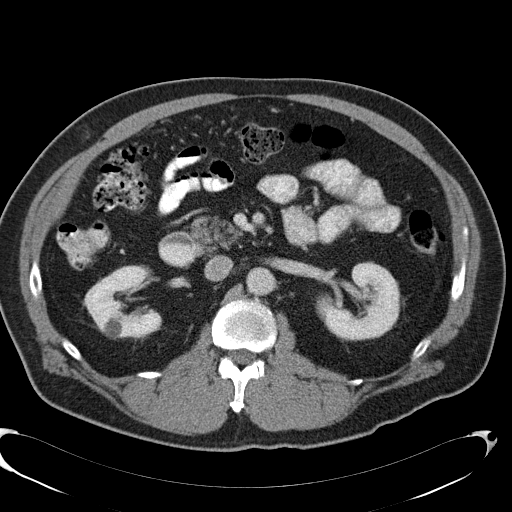
[im 66/105  bone]
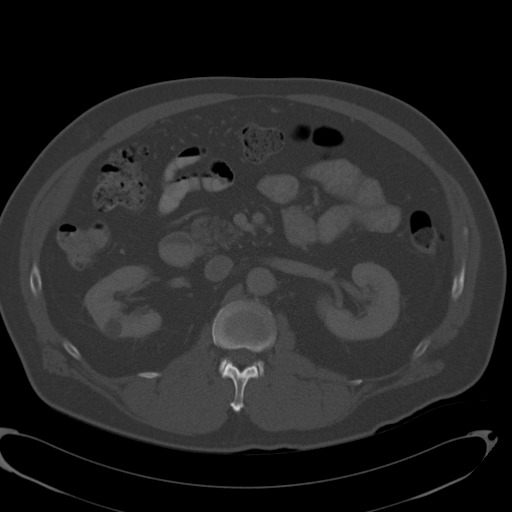
[im 72/105  soft-tissue]
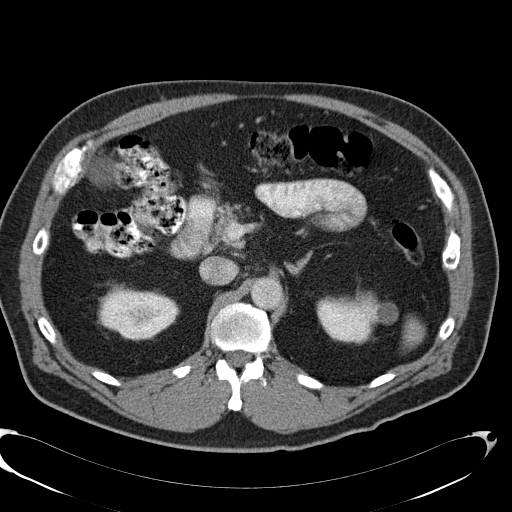
[im 85/105  soft-tissue]
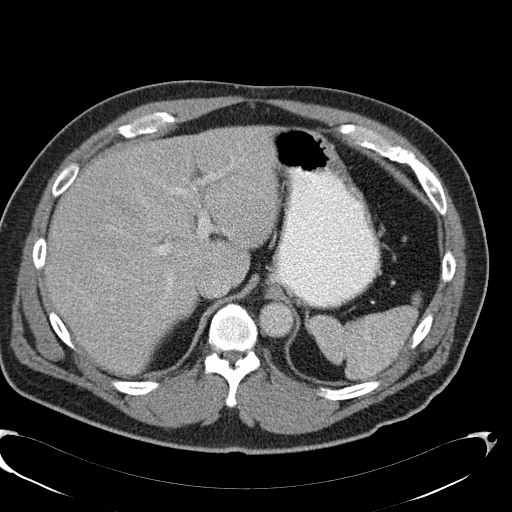
[im 92/105  soft-tissue]
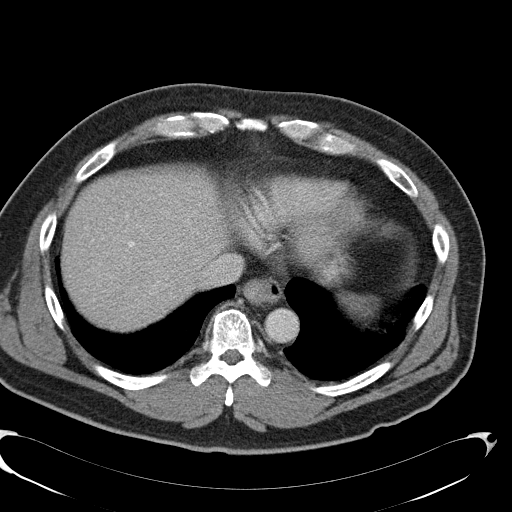
[im 98/105  soft-tissue]
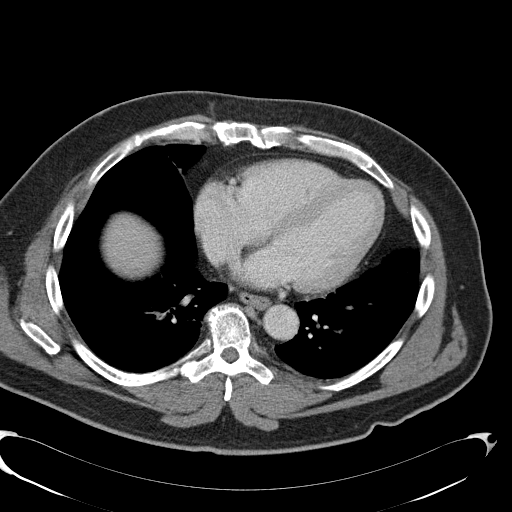

[Series 3: coronal · coronal · 0.90mm/px · 3 of 158 slices shown]
[im 53/158  soft-tissue]
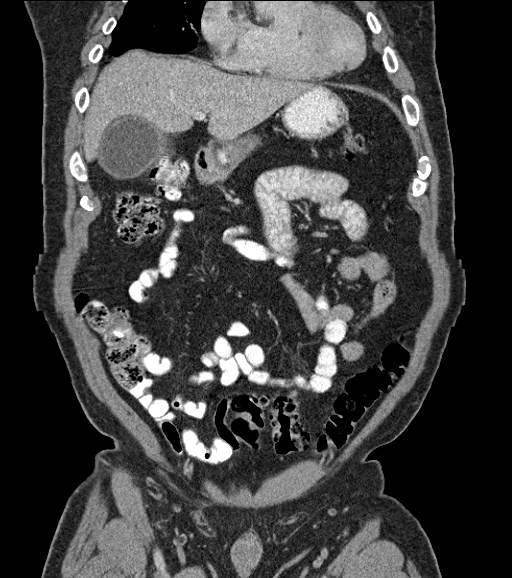
[im 70/158  soft-tissue]
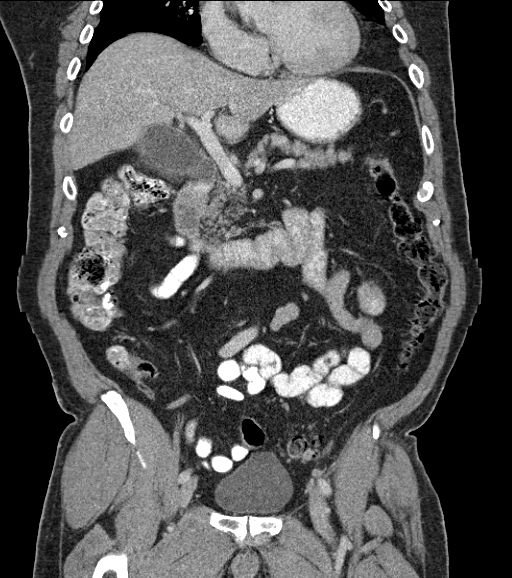
[im 88/158  soft-tissue]
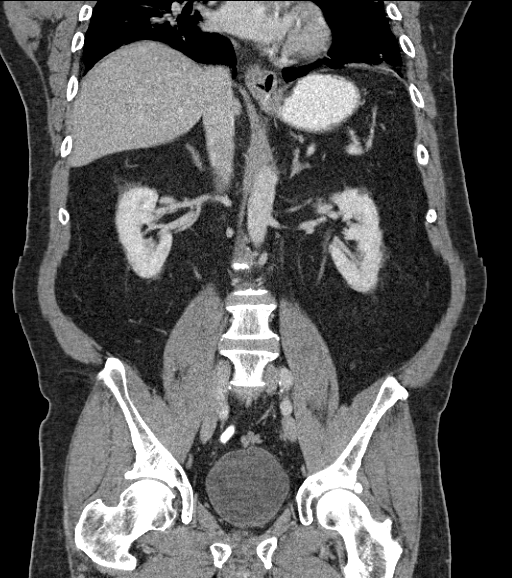

[16 of 46 positions shown; findings below may reference images not displayed]

FINDINGS: Minimal bibasilar atelectatic changes of the lungs. The visualized
lung bases are otherwise clear. No intra-abdominal free air or free
fluid.

Probable mild fatty infiltration of the liver. The gallbladder is
mildly distended. There is enhancement of the gallbladder mucosa
with haziness of the gallbladder wall. Small pericholecystic fluid
noted. No definite calcified stone identified. 80 noncalcified
obstructing stone at the neck of the gallbladder or cystic duct is
not excluded. Ultrasound is recommended for better evaluation of the
gallbladder. The pancreas, spleen, adrenal glands appear
unremarkable. Bilateral renal hypodense lesions measuring up to 18
mm in the upper pole of the left kidney most compatible with cysts.
Small nonobstructing left renal inferior pole adjacent on with a
combined diameter of 6 mm. There is no hydronephrosis on either
side. The visualized ureters, the urinary bladder, the prostate, and
seminal vesicles are grossly unremarkable.

There is sigmoid diverticulosis without active inflammatory changes.
Moderate stool noted throughout the colon. There is no evidence of
small-bowel obstruction or active inflammation. Small hiatal hernia
noted. Normal appendix.

The abdominal aorta and IVC appear unremarkable. No portal venous
gas identified. There is no adenopathy. There is a small fat
containing umbilical hernia. Partially visualized small bilateral
testicular hydroceles. Ultrasound may provide better evaluation of
scrotum and testicles. There is mild degenerative changes of the
spine. No acute fracture.
IMPRESSION: Inflammatory changes of the gallbladder most compatible with acute
cholecystitis. No calcified gallstone identified. However a
noncalcified stone at the neck of the gallbladder or proximal cystic
duct is suspected. Ultrasound is recommended for better evaluation
of the gallbladder.

Small nonobstructing left renal calculus.  No hydronephrosis.

Sigmoid diverticulosis. No evidence of bowel obstruction or active
inflammation. Normal appendix.

## 2017-11-06 ENCOUNTER — Encounter (HOSPITAL_COMMUNITY): Payer: Self-pay | Admitting: Emergency Medicine

## 2017-11-06 ENCOUNTER — Emergency Department (HOSPITAL_COMMUNITY): Payer: PRIVATE HEALTH INSURANCE

## 2017-11-06 ENCOUNTER — Observation Stay (HOSPITAL_COMMUNITY)
Admission: EM | Admit: 2017-11-06 | Discharge: 2017-11-07 | Disposition: A | Discharge status: Home / Self Care | Payer: PRIVATE HEALTH INSURANCE | Attending: Internal Medicine | Admitting: Internal Medicine

## 2017-11-06 ENCOUNTER — Other Ambulatory Visit: Payer: Self-pay

## 2017-11-06 DIAGNOSIS — R001 Bradycardia, unspecified: Secondary | ICD-10-CM | POA: Diagnosis not present

## 2017-11-06 DIAGNOSIS — M545 Low back pain, unspecified: Secondary | ICD-10-CM

## 2017-11-06 DIAGNOSIS — I10 Essential (primary) hypertension: Secondary | ICD-10-CM | POA: Diagnosis present

## 2017-11-06 DIAGNOSIS — R531 Weakness: Secondary | ICD-10-CM

## 2017-11-06 DIAGNOSIS — Z79899 Other long term (current) drug therapy: Secondary | ICD-10-CM | POA: Insufficient documentation

## 2017-11-06 LAB — CBC WITH DIFFERENTIAL/PLATELET
BASOS ABS: 0 10*3/uL (ref 0.0–0.1)
BASOS PCT: 0 %
Eosinophils Absolute: 0.1 10*3/uL (ref 0.0–0.7)
Eosinophils Relative: 1 %
HEMATOCRIT: 47.6 % (ref 39.0–52.0)
HEMOGLOBIN: 16.3 g/dL (ref 13.0–17.0)
Lymphocytes Relative: 12 %
Lymphs Abs: 0.9 10*3/uL (ref 0.7–4.0)
MCH: 32.6 pg (ref 26.0–34.0)
MCHC: 34.2 g/dL (ref 30.0–36.0)
MCV: 95.2 fL (ref 78.0–100.0)
Monocytes Absolute: 0.4 10*3/uL (ref 0.1–1.0)
Monocytes Relative: 6 %
NEUTROS ABS: 5.8 10*3/uL (ref 1.7–7.7)
NEUTROS PCT: 81 %
Platelets: 206 10*3/uL (ref 150–400)
RBC: 5 MIL/uL (ref 4.22–5.81)
RDW: 13.6 % (ref 11.5–15.5)
WBC: 7.1 10*3/uL (ref 4.0–10.5)

## 2017-11-06 LAB — I-STAT TROPONIN, ED: Troponin i, poc: 0 ng/mL (ref 0.00–0.08)

## 2017-11-06 LAB — COMPREHENSIVE METABOLIC PANEL
ALBUMIN: 4.4 g/dL (ref 3.5–5.0)
ALK PHOS: 47 U/L (ref 38–126)
ALT: 33 U/L (ref 0–44)
ANION GAP: 7 (ref 5–15)
AST: 26 U/L (ref 15–41)
BILIRUBIN TOTAL: 1 mg/dL (ref 0.3–1.2)
BUN: 17 mg/dL (ref 6–20)
CALCIUM: 9.1 mg/dL (ref 8.9–10.3)
CO2: 24 mmol/L (ref 22–32)
Chloride: 105 mmol/L (ref 98–111)
Creatinine, Ser: 1.14 mg/dL (ref 0.61–1.24)
GFR calc Af Amer: >60 mL/min (ref >60)
GFR calc non Af Amer: >60 mL/min (ref >60)
Glucose, Bld: 117 mg/dL — ABNORMAL HIGH (ref 70–99)
Potassium: 4.4 mmol/L (ref 3.5–5.1)
Sodium: 136 mmol/L (ref 135–145)
TOTAL PROTEIN: 8 g/dL (ref 6.5–8.1)

## 2017-11-06 LAB — MAGNESIUM: Magnesium: 2.1 mg/dL (ref 1.7–2.4)

## 2017-11-06 MED ORDER — SODIUM CHLORIDE 0.9 % IV SOLN: 250.0000 mL | INTRAVENOUS | Status: DC | PRN

## 2017-11-06 MED ORDER — AMLODIPINE BESYLATE 5 MG PO TABS
5.0000 mg | ORAL_TABLET | Freq: Every day | ORAL | Status: DC
  Administered 2017-11-07: 5 mg via ORAL
  Filled 2017-11-06: qty 1

## 2017-11-06 MED ORDER — ONDANSETRON HCL 4 MG PO TABS: 4.0000 mg | ORAL_TABLET | Freq: Four times a day (QID) | ORAL | Status: DC | PRN

## 2017-11-06 MED ORDER — SODIUM CHLORIDE 0.9 % IV BOLUS
1000.0000 mL | Freq: Once | INTRAVENOUS | Status: AC
  Administered 2017-11-06: 1000 mL via INTRAVENOUS

## 2017-11-06 MED ORDER — IRBESARTAN 75 MG PO TABS
37.5000 mg | ORAL_TABLET | Freq: Every day | ORAL | Status: DC
  Administered 2017-11-07: 37.5 mg via ORAL
  Filled 2017-11-06: qty 0.5
  Filled 2017-11-06: qty 1

## 2017-11-06 MED ORDER — ENOXAPARIN SODIUM 40 MG/0.4ML ~~LOC~~ SOLN
40.0000 mg | SUBCUTANEOUS | Status: DC
  Administered 2017-11-06: 40 mg via SUBCUTANEOUS
  Filled 2017-11-06: qty 0.4

## 2017-11-06 MED ORDER — ACETAMINOPHEN 650 MG RE SUPP: 650.0000 mg | Freq: Four times a day (QID) | RECTAL | Status: DC | PRN

## 2017-11-06 MED ORDER — IBUPROFEN 400 MG PO TABS: 400.0000 mg | ORAL_TABLET | Freq: Four times a day (QID) | ORAL | Status: DC | PRN

## 2017-11-06 MED ORDER — ONDANSETRON HCL 4 MG/2ML IJ SOLN: 4.0000 mg | Freq: Four times a day (QID) | INTRAMUSCULAR | Status: DC | PRN

## 2017-11-06 MED ORDER — SODIUM CHLORIDE 0.9% FLUSH
3.0000 mL | Freq: Two times a day (BID) | INTRAVENOUS | Status: DC
  Administered 2017-11-06 – 2017-11-07 (×2): 3 mL via INTRAVENOUS

## 2017-11-06 MED ORDER — SODIUM CHLORIDE 0.9% FLUSH: 3.0000 mL | INTRAVENOUS | Status: DC | PRN

## 2017-11-06 MED ORDER — ACETAMINOPHEN 325 MG PO TABS: 650.0000 mg | ORAL_TABLET | Freq: Four times a day (QID) | ORAL | Status: DC | PRN

## 2017-11-06 NOTE — ED Triage Notes (Signed)
Patient c/o multiple "episodes" in which his whole body starts tingling, he becomes nauseated, and mouth gets dry. Patient states started this morning when he was leaving his house. Denies feeling of passing out or any pain. Patient does report some confusion during these episodes. Per patient episodes last approx 1-2 minutes.

## 2017-11-06 NOTE — H&P (Signed)
History and Physical    ARVINE CLAYBURN FWY:[id number] DOB: [birth year] DOA: 11/06/2017  PCP: Celene Squibb, MD   Patient coming from: Home  Chief Complaint: Whole body tingling  HPI: [name] is a 62 y.o. male with medical history significant for hypertension, low back pain, and prior nephrolithiasis who presented to the emergency department with complaints of whole body tingling and weakness that would last approximately 1 to 2 minutes at a time and spontaneously resolved.  He has no particular aggravating or alleviating factors noted and states that these episodes would occur regardless of what he was doing.  He states that he had had similar episodes over a year and a half ago, but he has not had any episodes since.  He denies any chest pain, palpitations, shortness of breath, lightheadedness, dizziness, headache, visual disturbance, or any other symptoms.  He is quite concerned about the symptoms given the fact that he has significant family history of heart disease with his father and his brother both having had CABG in their 47s.  He continues to take his blood pressure medications at home as prescribed and denies any new medications or over-the-counter medications.   ED Course: Vital signs are stable.  He was noted during an episode in the ED that his heart rate went down to 29 bpm, but this was not caught on a telemetry strip.  His heart rate spontaneously improved within a minute and his EKG demonstrates rates of 58 bpm with sinus rhythm.  He is currently asymptomatic laboratory data does not show any abnormalities.  1 view chest x-ray with no acute findings.  Troponin is currently 0.  Review of Systems: All others reviewed and otherwise negative.  Past Medical History:  Diagnosis Date  . Hypertension   . Renal disorder    kidney stones 07-08    Past Surgical History:  Procedure Laterality Date  . CHOLECYSTECTOMY N/A 09/01/2015   Procedure: LAPAROSCOPIC CHOLECYSTECTOMY;  Surgeon:  Aviva Signs, MD;  Location: [facility];  Service: General;  Laterality: N/A;     reports that he has never smoked. He has never used smokeless tobacco. He reports that he does not drink alcohol or use drugs.  Allergies  Allergen Reactions  . Ciprofloxacin Other (See Comments)    Reaction that resulted in infection in intestines    History reviewed. No pertinent family history.  Prior to Admission medications   Medication Sig Start Date End Date Taking? Authorizing Provider  amLODipine (NORVASC) 5 MG tablet Take 1 tablet (5 mg total) by mouth daily. 03/22/16  Yes Virgel Manifold, MD  ibuprofen (ADVIL,MOTRIN) 200 MG tablet Take 400 mg by mouth every 6 (six) hours as needed for mild pain or moderate pain.    Yes [provider]  naproxen sodium (ALEVE) 220 MG tablet Take 220 mg by mouth daily as needed.   Yes [provider]  olmesartan (BENICAR) 5 MG tablet Take 5 mg by mouth daily. 10/10/17  Yes [provider]    Physical Exam: Vitals:   11/06/17 1345 11/06/17 1400 11/06/17 1415 11/06/17 1430  BP:    (!) 144/79  Pulse: (!) 57 (!) 57 (!) 58 (!) 59  Resp: 17 18 14 17   Temp:      TempSrc:      SpO2: 100% 99% 98% 100%  Weight:      Height:        Constitutional: NAD, calm, comfortable Vitals:   11/06/17 1345 11/06/17 1400 11/06/17 1415 11/06/17  1430  BP:    (!) 144/79  Pulse: (!) 57 (!) 57 (!) 58 (!) 59  Resp: 17 18 14 17   Temp:      TempSrc:      SpO2: 100% 99% 98% 100%  Weight:      Height:       Eyes: lids and conjunctivae normal ENMT: Mucous membranes are moist.  Neck: normal, supple Respiratory: clear to auscultation bilaterally. Normal respiratory effort. No accessory muscle use.  Cardiovascular: Regular rate and rhythm, no murmurs. No extremity edema. Abdomen: no tenderness, no distention. Bowel sounds positive.  Musculoskeletal:  No joint deformity upper and lower extremities.   Skin: no rashes, lesions, ulcers.  Psychiatric: Normal  judgment and insight. Alert and oriented x 3. Normal mood.   Labs on Admission: I have personally reviewed following labs and imaging studies  CBC: Recent Labs  Lab 11/06/17 1340  WBC 7.1  NEUTROABS 5.8  HGB 16.3  HCT 47.6  MCV 95.2  PLT 885   Basic Metabolic Panel: Recent Labs  Lab 11/06/17 1340  NA 136  K 4.4  CL 105  CO2 24  GLUCOSE 117*  BUN 17  CREATININE 1.14  CALCIUM 9.1   GFR: Estimated Creatinine Clearance: 72.2 mL/min (by C-G formula based on SCr of 1.14 mg/dL). Liver Function Tests: Recent Labs  Lab 11/06/17 1340  AST 26  ALT 33  ALKPHOS 47  BILITOT 1.0  PROT 8.0  ALBUMIN 4.4   No results for input(s): LIPASE, AMYLASE in the last 168 hours. No results for input(s): AMMONIA in the last 168 hours. Coagulation Profile: No results for input(s): INR, PROTIME in the last 168 hours. Cardiac Enzymes: No results for input(s): CKTOTAL, CKMB, CKMBINDEX, TROPONINI in the last 168 hours. BNP (last 3 results) No results for input(s): PROBNP in the last 8760 hours. HbA1C: No results for input(s): HGBA1C in the last 72 hours. CBG: No results for input(s): GLUCAP in the last 168 hours. Lipid Profile: No results for input(s): CHOL, HDL, LDLCALC, TRIG, CHOLHDL, LDLDIRECT in the last 72 hours. Thyroid Function Tests: No results for input(s): TSH, T4TOTAL, FREET4, T3FREE, THYROIDAB in the last 72 hours. Anemia Panel: No results for input(s): VITAMINB12, FOLATE, FERRITIN, TIBC, IRON, RETICCTPCT in the last 72 hours. Urine analysis:    Component Value Date/Time   COLORURINE YELLOW 03/22/2016 1900   APPEARANCEUR CLEAR 03/22/2016 1900   LABSPEC 1.018 03/22/2016 1900   PHURINE 5.0 03/22/2016 1900   GLUCOSEU NEGATIVE 03/22/2016 1900   HGBUR NEGATIVE 03/22/2016 1900   BILIRUBINUR NEGATIVE 03/22/2016 1900   KETONESUR NEGATIVE 03/22/2016 1900   PROTEINUR NEGATIVE 03/22/2016 1900   UROBILINOGEN 0.2 06/02/2014 1720   NITRITE NEGATIVE 03/22/2016 1900   LEUKOCYTESUR  NEGATIVE 03/22/2016 1900    Radiological Exams on Admission: Dg Chest Portable 1 View  Result Date: 11/06/2017 CLINICAL DATA:  Weakness and sensation of diffuse numbness and tingling today. EXAM: PORTABLE CHEST 1 VIEW COMPARISON:  PA and lateral chest 08/29/2015. FINDINGS: Defibrillator pad is in place. Lungs are clear. Heart size is normal. No pneumothorax or pleural effusion. No bony abnormality. IMPRESSION: Negative chest. Electronically Signed   By: Inge Rise M.D.   On: 11/06/2017 14:26    EKG: Independently reviewed. 58bpm SR; no significant findings.  Assessment/Plan Principal Problem:   Symptomatic bradycardia Active Problems:   HTN (hypertension)   Low back pain    1. Symptomatic bradycardia.  Continue observation overnight on telemetry and maintain on home blood pressure agents.  No chest  pain noted and therefore, no need to monitor troponins at this time.  I will order 2D echocardiogram to assess for any structural abnormalities.  Will likely require Holter monitoring on discharge which I will arrange.  Repeat EKG in a.m. 2. Hypertension.  Well-controlled, continue amlodipine and losartan. 3. Low back pain.  Continue on NSAIDs and Tylenol as needed.   DVT prophylaxis: Lovenox Code Status: Full Code Family Communication:None at bedside  Disposition Plan:Observation on tele overnight Consults called:None; EDP spoke with Cardiology who feels pt should be observed overnight Admission status: Obs, Tele   Sourish Allender Darleen Crocker DO [facility] Pager [phone]  If 7PM-7AM, please contact night-coverage [email or web address] Password [email or web address]  11/06/2017, 3:20 PM

## 2017-11-06 NOTE — ED Provider Notes (Signed)
[facility] EMERGENCY DEPARTMENT Provider Note   CSN: [id number] Arrival date & time: 11/06/17  1215     History   Chief Complaint Chief Complaint  Patient presents with  . Tingling    HPI [name] is a 62 y.o. male.  Patient states that about 5 or 6 times today he felt tingly all over and generally weak.  No other symptoms.  He has not been sick lately with any fever chills cough.    The history is provided by the patient.  Weakness  Primary symptoms include no focal weakness. This is a new problem. The current episode started less than 1 hour ago. The problem has been resolved. There was no focality noted. There has been no fever. Pertinent negatives include no shortness of breath, no chest pain and no headaches. There were no medications administered prior to arrival.    Past Medical History:  Diagnosis Date  . Hypertension   . Renal disorder    kidney stones 07-08    Patient Active Problem List   Diagnosis Date Noted  . Acute calculous cholecystitis 09/01/2015  . Cholelithiasis with acute cholecystitis with biliary obstruction 09/01/2015  . Acute acalculous cholecystitis 08/30/2015  . Nephrolithiasis 08/30/2015  . HTN (hypertension) 08/30/2015    Past Surgical History:  Procedure Laterality Date  . CHOLECYSTECTOMY N/A 09/01/2015   Procedure: LAPAROSCOPIC CHOLECYSTECTOMY;  Surgeon: Aviva Signs, MD;  Location: [facility];  Service: General;  Laterality: N/A;        Home Medications    Prior to Admission medications   Medication Sig Start Date End Date Taking? Authorizing Provider  amLODipine (NORVASC) 5 MG tablet Take 1 tablet (5 mg total) by mouth daily. 03/22/16  Yes Virgel Manifold, MD  ibuprofen (ADVIL,MOTRIN) 200 MG tablet Take 400 mg by mouth every 6 (six) hours as needed for mild pain or moderate pain.    Yes [provider]  naproxen sodium (ALEVE) 220 MG tablet Take 220 mg by mouth daily as needed.   Yes [provider]  olmesartan  (BENICAR) 5 MG tablet Take 5 mg by mouth daily. 10/10/17  Yes [provider]    Family History History reviewed. No pertinent family history.  Social History Social History   Tobacco Use  . Smoking status: Never Smoker  . Smokeless tobacco: Never Used  Substance Use Topics  . Alcohol use: No  . Drug use: No     Allergies   Ciprofloxacin   Review of Systems Review of Systems  Constitutional: Positive for fatigue. Negative for appetite change.  HENT: Negative for congestion, ear discharge and sinus pressure.   Eyes: Negative for discharge.  Respiratory: Negative for cough and shortness of breath.   Cardiovascular: Negative for chest pain.  Gastrointestinal: Negative for abdominal pain and diarrhea.  Genitourinary: Negative for frequency and hematuria.  Musculoskeletal: Negative for back pain.  Skin: Negative for rash.  Neurological: Positive for weakness. Negative for focal weakness, seizures and headaches.  Psychiatric/Behavioral: Negative for hallucinations.     Physical Exam Updated Vital Signs BP (!) 144/79   Pulse (!) 59   Temp 97.7 F (36.5 C) (Oral)   Resp 17   Ht 5\' 7"  (1.702 m)   Wt 86.2 kg   SpO2 100%   BMI 29.76 kg/m   Physical Exam  Constitutional: He is oriented to person, place, and time. He appears well-developed.  HENT:  Head: Normocephalic.  Eyes: Conjunctivae and EOM are normal. No scleral icterus.  Neck: Neck  supple. No thyromegaly present.  Cardiovascular: Normal rate and regular rhythm. Exam reveals no gallop and no friction rub.  No murmur heard. Pulmonary/Chest: No stridor. He has no wheezes. He has no rales. He exhibits no tenderness.  Abdominal: He exhibits no distension. There is no tenderness. There is no rebound.  Musculoskeletal: Normal range of motion. He exhibits no edema.  Lymphadenopathy:    He has no cervical adenopathy.  Neurological: He is oriented to person, place, and time. He exhibits normal muscle tone.  Coordination normal.  Skin: No rash noted. No erythema.  Psychiatric: He has a normal mood and affect. His behavior is normal.     ED Treatments / Results  Labs (all labs ordered are listed, but only abnormal results are displayed) Labs Reviewed  COMPREHENSIVE METABOLIC PANEL - Abnormal; Notable for the following components:      Result Value   Glucose, Bld 117 (*)    All other components within normal limits  CBC WITH DIFFERENTIAL/PLATELET  I-STAT TROPONIN, ED    EKG EKG Interpretation  Date/Time:  Sunday November 06 2017 12:33:34 EDT Ventricular Rate:  50 PR Interval:  180 QRS Duration: 100 QT Interval:  422 QTC Calculation: 384 R Axis:   -10 Text Interpretation:  Sinus bradycardia Otherwise normal ECG Confirmed by Milton Ferguson [id number]) on 11/06/2017 1:31:27 PM   Radiology Dg Chest Portable 1 View  Result Date: 11/06/2017 CLINICAL DATA:  Weakness and sensation of diffuse numbness and tingling today. EXAM: PORTABLE CHEST 1 VIEW COMPARISON:  PA and lateral chest 08/29/2015. FINDINGS: Defibrillator pad is in place. Lungs are clear. Heart size is normal. No pneumothorax or pleural effusion. No bony abnormality. IMPRESSION: Negative chest. Electronically Signed   By: Inge Rise M.D.   On: 11/06/2017 14:26    Procedures Procedures (including critical care time)  Medications Ordered in ED Medications  sodium chloride 0.9 % bolus 1,000 mL (1,000 mLs Intravenous New Bag/Given 11/06/17 1350)     Initial Impression / Assessment and Plan / ED Course  I have reviewed the triage vital signs and the nursing notes.  Pertinent labs & imaging results that were available during my care of the patient were reviewed by me and considered in my medical decision making (see chart for details).     While patient was on the monitor in the emergency department he had an episode of this tingling and weakness that lasted about a minute or so.  It appears that his heart rate dropped down  to 29 and he may have been and complete heart block.  The tracing is poor.  I spoke to cardiology at [facility] and they recommended observation admission to any [facility]  Final Clinical Impressions(s) / ED Diagnoses   Final diagnoses:  Weakness    ED Discharge Orders    None       Milton Ferguson, MD 11/06/17 1510

## 2017-11-07 ENCOUNTER — Observation Stay (HOSPITAL_BASED_OUTPATIENT_CLINIC_OR_DEPARTMENT_OTHER): Payer: PRIVATE HEALTH INSURANCE

## 2017-11-07 DIAGNOSIS — R001 Bradycardia, unspecified: Secondary | ICD-10-CM | POA: Diagnosis not present

## 2017-11-07 DIAGNOSIS — I340 Nonrheumatic mitral (valve) insufficiency: Secondary | ICD-10-CM

## 2017-11-07 NOTE — Progress Notes (Signed)
*  PRELIMINARY RESULTS* Echocardiogram 2D Echocardiogram has been performed.  Leavy Cella 11/07/2017, 9:00 AM

## 2017-11-07 NOTE — Progress Notes (Signed)
Patient states understanding of discharge instructions.  

## 2017-11-07 NOTE — Discharge Summary (Signed)
Physician Discharge Summary  [name] FYB:[id number] DOB: Oct 13, 1955 DOA: 11/06/2017  PCP: Celene Squibb, MD  Admit date: 11/06/2017  Discharge date: 11/07/2017  Admitted From:Home  Disposition:  Home  Recommendations for Outpatient Follow-up:  1. Follow up with PCP in 1-2 weeks 2. Follow-up with cardiology regarding Holter monitoring  Home Health:N/A  Equipment/Devices:None  Discharge Condition:Stable  CODE STATUS: Full  Diet recommendation: Heart Healthy  Brief/Interim Summary: [name] is a 62 y.o. male with medical history significant for hypertension, low back pain, and prior nephrolithiasis who presented to the emergency department with complaints of whole body tingling and weakness that would last approximately 1 to 2 minutes at a time and spontaneously resolved.  He had no further symptoms during the course of this admission and no findings on telemetry monitoring despite some initial noted bradycardia.  He did have a 2D echocardiogram with results currently pending.  He has no symptoms whatsoever and has otherwise been comfortable.  He will be set up for Holter monitoring in the outpatient setting.  He is encouraged to follow-up with his PCP in 2 weeks.  No other acute events noted during the course of this admission.  He will continue his usual home blood pressure medications on discharge.  Discharge Diagnoses:  Principal Problem:   Symptomatic bradycardia Active Problems:   HTN (hypertension)   Low back pain   Bradycardia    Discharge Instructions  Discharge Instructions    Diet - low sodium heart healthy   Complete by:  As directed    Increase activity slowly   Complete by:  As directed      Allergies as of 11/07/2017      Reactions   Ciprofloxacin Other (See Comments)   Reaction that resulted in infection in intestines      Medication List    TAKE these medications   ALEVE 220 MG tablet Generic drug:  naproxen sodium Take 220 mg by mouth daily  as needed.   amLODipine 5 MG tablet Commonly known as:  NORVASC Take 1 tablet (5 mg total) by mouth daily.   ibuprofen 200 MG tablet Commonly known as:  ADVIL,MOTRIN Take 400 mg by mouth every 6 (six) hours as needed for mild pain or moderate pain.   olmesartan 5 MG tablet Commonly known as:  BENICAR Take 5 mg by mouth daily.      Follow-up Information    Celene Squibb, MD Follow up in 2 week(s).   Specialty:  Internal Medicine Contact information: [place] [address] [phone]          Allergies  Allergen Reactions  . Ciprofloxacin Other (See Comments)    Reaction that resulted in infection in intestines    Consultations:  None   Procedures/Studies: Dg Chest Portable 1 View  Result Date: 11/06/2017 CLINICAL DATA:  Weakness and sensation of diffuse numbness and tingling today. EXAM: PORTABLE CHEST 1 VIEW COMPARISON:  PA and lateral chest 08/29/2015. FINDINGS: Defibrillator pad is in place. Lungs are clear. Heart size is normal. No pneumothorax or pleural effusion. No bony abnormality. IMPRESSION: Negative chest. Electronically Signed   By: Inge Rise M.D.   On: 11/06/2017 14:26     Discharge Exam: Vitals:   11/07/17 0624 11/07/17 1240  BP: 126/89 130/80  Pulse: (!) 52 (!) 55  Resp: 14 15  Temp: 97.8 F (36.6 C) 97.7 F (36.5 C)  SpO2: 95% 96%   Vitals:   11/06/17 1706 11/06/17 2233  11/07/17 0624 11/07/17 1240  BP: 133/79 125/82 126/89 130/80  Pulse: (!) 55 (!) 54 (!) 52 (!) 55  Resp: 16 20 14 15   Temp: 98 F (36.7 C) 97.8 F (36.6 C) 97.8 F (36.6 C) 97.7 F (36.5 C)  TempSrc: Oral Oral Oral Oral  SpO2: 97% 95% 95% 96%  Weight: 92 kg     Height: 5\' 7"  (1.702 m)       General: Pt is alert, awake, not in acute distress Cardiovascular: RRR, S1/S2 +, no rubs, no gallops Respiratory: CTA bilaterally, no wheezing, no rhonchi Abdominal: Soft, NT, ND, bowel sounds + Extremities: no edema, no cyanosis    The results  of significant diagnostics from this hospitalization (including imaging, microbiology, ancillary and laboratory) are listed below for reference.     Microbiology: No results found for this or any previous visit (from the past 240 hour(s)).   Labs: BNP (last 3 results) No results for input(s): BNP in the last 8760 hours. Basic Metabolic Panel: Recent Labs  Lab 11/06/17 1340  NA 136  K 4.4  CL 105  CO2 24  GLUCOSE 117*  BUN 17  CREATININE 1.14  CALCIUM 9.1  MG 2.1   Liver Function Tests: Recent Labs  Lab 11/06/17 1340  AST 26  ALT 33  ALKPHOS 47  BILITOT 1.0  PROT 8.0  ALBUMIN 4.4   No results for input(s): LIPASE, AMYLASE in the last 168 hours. No results for input(s): AMMONIA in the last 168 hours. CBC: Recent Labs  Lab 11/06/17 1340  WBC 7.1  NEUTROABS 5.8  HGB 16.3  HCT 47.6  MCV 95.2  PLT 206   Cardiac Enzymes: No results for input(s): CKTOTAL, CKMB, CKMBINDEX, TROPONINI in the last 168 hours. BNP: Invalid input(s): POCBNP CBG: No results for input(s): GLUCAP in the last 168 hours. D-Dimer No results for input(s): DDIMER in the last 72 hours. Hgb A1c No results for input(s): HGBA1C in the last 72 hours. Lipid Profile No results for input(s): CHOL, HDL, LDLCALC, TRIG, CHOLHDL, LDLDIRECT in the last 72 hours. Thyroid function studies No results for input(s): TSH, T4TOTAL, T3FREE, THYROIDAB in the last 72 hours.  Invalid input(s): FREET3 Anemia work up No results for input(s): VITAMINB12, FOLATE, FERRITIN, TIBC, IRON, RETICCTPCT in the last 72 hours. Urinalysis    Component Value Date/Time   COLORURINE YELLOW 03/22/2016 1900   APPEARANCEUR CLEAR 03/22/2016 1900   LABSPEC 1.018 03/22/2016 1900   PHURINE 5.0 03/22/2016 1900   GLUCOSEU NEGATIVE 03/22/2016 1900   HGBUR NEGATIVE 03/22/2016 1900   BILIRUBINUR NEGATIVE 03/22/2016 1900   KETONESUR NEGATIVE 03/22/2016 1900   PROTEINUR NEGATIVE 03/22/2016 1900   UROBILINOGEN 0.2 06/02/2014 1720    NITRITE NEGATIVE 03/22/2016 1900   LEUKOCYTESUR NEGATIVE 03/22/2016 1900   Sepsis Labs Invalid input(s): PROCALCITONIN,  WBC,  LACTICIDVEN Microbiology No results found for this or any previous visit (from the past 240 hour(s)).   Time coordinating discharge: 35 minutes  SIGNED:   Rodena Goldmann, DO [facility] 11/07/2017, 4:30 PM Pager [phone]  If 7PM-7AM, please contact night-coverage [email or web address] Password [email or web address]

## 2017-11-08 LAB — ECHOCARDIOGRAM COMPLETE
HEIGHTINCHES: 67 in
WEIGHTICAEL: 3245.17 [oz_av]

## 2017-11-08 LAB — HIV ANTIBODY (ROUTINE TESTING W REFLEX): HIV SCREEN 4TH GENERATION: NONREACTIVE

## 2017-11-16 ENCOUNTER — Ambulatory Visit (HOSPITAL_COMMUNITY)
Admission: RE | Admit: 2017-11-16 | Discharge: 2017-11-16 | Disposition: A | Discharge status: Home / Self Care | Payer: PRIVATE HEALTH INSURANCE | Source: Ambulatory Visit | Attending: Internal Medicine | Admitting: Internal Medicine

## 2017-11-16 DIAGNOSIS — R002 Palpitations: Secondary | ICD-10-CM

## 2018-04-24 ENCOUNTER — Other Ambulatory Visit (HOSPITAL_COMMUNITY): Payer: Self-pay | Admitting: Internal Medicine

## 2018-04-24 DIAGNOSIS — R1030 Lower abdominal pain, unspecified: Secondary | ICD-10-CM

## 2018-05-01 ENCOUNTER — Encounter: Payer: Self-pay | Admitting: Internal Medicine

## 2018-05-02 ENCOUNTER — Ambulatory Visit (HOSPITAL_COMMUNITY)
Admission: RE | Admit: 2018-05-02 | Discharge: 2018-05-02 | Disposition: A | Discharge status: Home / Self Care | Payer: PRIVATE HEALTH INSURANCE | Source: Ambulatory Visit | Attending: Internal Medicine | Admitting: Internal Medicine

## 2018-05-02 DIAGNOSIS — R1030 Lower abdominal pain, unspecified: Secondary | ICD-10-CM | POA: Insufficient documentation

## 2018-07-05 ENCOUNTER — Other Ambulatory Visit: Payer: Self-pay

## 2018-07-05 ENCOUNTER — Encounter: Payer: Self-pay | Admitting: Gastroenterology

## 2018-07-05 ENCOUNTER — Telehealth: Payer: Self-pay

## 2018-07-05 ENCOUNTER — Ambulatory Visit (INDEPENDENT_AMBULATORY_CARE_PROVIDER_SITE_OTHER): Payer: Self-pay | Admitting: Gastroenterology

## 2018-07-05 DIAGNOSIS — R933 Abnormal findings on diagnostic imaging of other parts of digestive tract: Secondary | ICD-10-CM

## 2018-07-05 DIAGNOSIS — Z8719 Personal history of other diseases of the digestive system: Secondary | ICD-10-CM

## 2018-07-05 DIAGNOSIS — R194 Change in bowel habit: Secondary | ICD-10-CM

## 2018-07-05 MED ORDER — PEG 3350-KCL-NA BICARB-NACL 420 G PO SOLR: 4000.0000 mL | ORAL | 0 refills | Status: DC

## 2018-07-05 NOTE — H&P (View-Only) (Signed)
Primary Care Physician:  Celene Squibb, MD Primary GI:  Garfield Cornea, MD    Patient Location: Home  Provider Location: [facility] office  Reason for Visit: colonoscopy, lower abdominal pain, constipation and diarrhea, diverticulitis  Persons present on the virtual encounter, with roles: Patient, myself (provider), Zara Council, LPN (updated meds and allergies)  Total time (minutes) spent on medical discussion: 15 minutes  Due to COVID-19, visit was conducted using [email or web address] method.  Visit was requested by patient.  Virtual Visit via [email or web address]  I connected with [name] on 07/05/18 at  2:30 PM EDT by [email or web address] and verified that I am speaking with the correct person using two identifiers.   I discussed the limitations, risks, security and privacy concerns of performing an evaluation and management service by telephone/video and the availability of in person appointments. I also discussed with the patient that there may be a patient responsible charge related to this service. The patient expressed understanding and agreed to proceed.   HPI:   [name] is a 63 y.o. male who presents for virtual visit regarding need for colonoscopy, diverticulitis, lower abdominal pain, alternating constipation and diarrhea at the request of Dr. Nevada Crane..  Back in February patient developed lower abdominal pain.  He had had similar episode in the past but it went away on its own.  This when he continued to have symptoms for several weeks, ultimately had a CT scan on February 25.  Scan was done without contrast.  He was noted to have rather extensive colonic diverticulosis, primarily involving the descending and sigmoid colon.  Suspected subtle area of superimposed acute diverticulitis involving the sigmoid colon without any evidence of perforation or fluid collection.  Colonoscopy recommended to exclude presence of underlying lesion.  For that episode of diverticulitis he had Bactrim and Flagyl.  He  states his abdominal pain has resolved.  Continues to have bowel concerns.  Some days may have diarrhea, some days constipation.  Nothing ever regular.  He does not take any medication to help him go or to slow things down.  No melena or rectal bleeding.  He has had some rectal soreness which he is used suppositories with good relief.  He also has chronic back pain.  Bothers him more at nighttime.  Takes ibuprofen in the evenings, several days per week.  Last colonoscopy about 15 years ago.  No family history of colon cancer.  No significant upper GI symptoms.  No weight loss.    Current Outpatient Medications  Medication Sig Dispense Refill  . amLODipine (NORVASC) 10 MG tablet Take 10 mg by mouth daily.    Marland Kitchen ibuprofen (ADVIL,MOTRIN) 200 MG tablet Take 400 mg by mouth every 6 (six) hours as needed for mild pain or moderate pain.     Marland Kitchen olmesartan (BENICAR) 5 MG tablet Take 5 mg by mouth daily.  1   No current facility-administered medications for this visit.     Past Medical History:  Diagnosis Date  . Hypertension   . Nephrolithiasis   . Renal disorder    kidney stones 07-08    Past Surgical History:  Procedure Laterality Date  . CHOLECYSTECTOMY N/A 09/01/2015   Procedure: LAPAROSCOPIC CHOLECYSTECTOMY;  Surgeon: Aviva Signs, MD;  Location: [facility];  Service: General;  Laterality: N/A;    Family History  Problem Relation Age of Onset  . Colon cancer Neg Hx     Social History   Socioeconomic History  . Marital status: Married  Spouse name: Not on file  . Number of children: Not on file  . Years of education: Not on file  . Highest education level: Not on file  Occupational History  . Not on file  Social Needs  . Financial resource strain: Not on file  . Food insecurity:    Worry: Not on file    Inability: Not on file  . Transportation needs:    Medical: Not on file    Non-medical: Not on file  Tobacco Use  . Smoking status: Never Smoker  . Smokeless tobacco:  Never Used  Substance and Sexual Activity  . Alcohol use: No    Comment: Last use 2015.  Never heavy  . Drug use: No  . Sexual activity: Not on file  Lifestyle  . Physical activity:    Days per week: Not on file    Minutes per session: Not on file  . Stress: Not on file  Relationships  . Social connections:    Talks on phone: Not on file    Gets together: Not on file    Attends religious service: Not on file    Active member of club or organization: Not on file    Attends meetings of clubs or organizations: Not on file    Relationship status: Not on file  . Intimate partner violence:    Fear of current or ex partner: Not on file    Emotionally abused: Not on file    Physically abused: Not on file    Forced sexual activity: Not on file  Other Topics Concern  . Not on file  Social History Narrative  . Not on file      ROS:  General: Negative for anorexia, weight loss, fever, chills, fatigue, weakness. Eyes: Negative for vision changes.  ENT: Negative for hoarseness, difficulty swallowing , nasal congestion. CV: Negative for chest pain, angina, palpitations, dyspnea on exertion, peripheral edema.  Respiratory: Negative for dyspnea at rest, dyspnea on exertion, cough, sputum, wheezing.  GI: See history of present illness. GU:  Negative for dysuria, hematuria, urinary incontinence, urinary frequency, nocturnal urination.  MS: Negative for joint pain.chronic low back pain.  Derm: Negative for rash or itching.  Neuro: Negative for weakness, abnormal sensation, seizure, frequent headaches, memory loss, confusion.  Psych: Negative for anxiety, depression, suicidal ideation, hallucinations.  Endo: Negative for unusual weight change.  Heme: Negative for bruising or bleeding. Allergy: Negative for rash or hives.   Observations/Objective: Pleasant well-nourished well-developed Caucasian male, no acute distress.  Otherwise exam unavailable.  Labs from PCP April 24, 2018: White  blood cell count 9900, hemoglobin 16, hematocrit 47.4, platelets 267,000, glucose 92, BUN 13, creatinine 1.13, albumin 4.3, total bilirubin 0.8, alkaline phosphatase 63, AST 20, ALT 18  Assessment and Plan: Pleasant 63 year old gentleman presenting to schedule colonoscopy.  He has had some lower abdominal pain, diagnosed with diverticulitis in February.  Abnormal sigmoid colon on scan, suggested colonoscopy.  Clinically diverticulitis has resolved.  He continues to have alternating constipation and diarrhea, some rectal discomfort possibly due to benign anorectal disease. Recommend colonoscopy in the near future to further evaluate abnormal CT finding of sigmoid colon, change in bowel habits.  I have discussed the risks, alternatives, benefits with regards to but not limited to the risk of reaction to medication, bleeding, infection, perforation and the patient is agreeable to proceed. Written consent to be obtained.   Follow Up Instructions:    I discussed the assessment and treatment plan with the patient.  The patient was provided an opportunity to ask questions and all were answered. The patient agreed with the plan and demonstrated an understanding of the instructions. AVS mailed to patient's home address.   The patient was advised to call back or seek an in-person evaluation if the symptoms worsen or if the condition fails to improve as anticipated.  I provided 15 minutes of virtual face-to-face time during this encounter.   Neil Crouch, PA-C

## 2018-07-05 NOTE — Patient Instructions (Signed)
1. Colonoscopy to be scheduled.  Please see separate instructions. 2. Call with any recurrent abdominal pain.

## 2018-07-05 NOTE — Telephone Encounter (Signed)
Called pt to schedule TCS, wife answered his phone and said she could schedule TCS for him. TCS w/RMR scheduled for 07/26/18 at 12:30pm. Rx for prep sent to pharmacy. Instructions mailed. Orders entered.

## 2018-07-05 NOTE — Progress Notes (Signed)
Primary Care Physician:  Celene Squibb, MD Primary GI:  Garfield Cornea, MD    Patient Location: Home  Provider Location: [facility] office  Reason for Visit: colonoscopy, lower abdominal pain, constipation and diarrhea, diverticulitis  Persons present on the virtual encounter, with roles: Patient, myself (provider), Zara Council, LPN (updated meds and allergies)  Total time (minutes) spent on medical discussion: 15 minutes  Due to COVID-19, visit was conducted using [email or web address] method.  Visit was requested by patient.  Virtual Visit via [email or web address]  I connected with [name] on 07/05/18 at  2:30 PM EDT by [email or web address] and verified that I am speaking with the correct person using two identifiers.   I discussed the limitations, risks, security and privacy concerns of performing an evaluation and management service by telephone/video and the availability of in person appointments. I also discussed with the patient that there may be a patient responsible charge related to this service. The patient expressed understanding and agreed to proceed.   HPI:   [name] is a 63 y.o. male who presents for virtual visit regarding need for colonoscopy, diverticulitis, lower abdominal pain, alternating constipation and diarrhea at the request of Dr. Nevada Crane..  Back in February patient developed lower abdominal pain.  He had had similar episode in the past but it went away on its own.  This when he continued to have symptoms for several weeks, ultimately had a CT scan on February 25.  Scan was done without contrast.  He was noted to have rather extensive colonic diverticulosis, primarily involving the descending and sigmoid colon.  Suspected subtle area of superimposed acute diverticulitis involving the sigmoid colon without any evidence of perforation or fluid collection.  Colonoscopy recommended to exclude presence of underlying lesion.  For that episode of diverticulitis he had Bactrim and Flagyl.  He  states his abdominal pain has resolved.  Continues to have bowel concerns.  Some days may have diarrhea, some days constipation.  Nothing ever regular.  He does not take any medication to help him go or to slow things down.  No melena or rectal bleeding.  He has had some rectal soreness which he is used suppositories with good relief.  He also has chronic back pain.  Bothers him more at nighttime.  Takes ibuprofen in the evenings, several days per week.  Last colonoscopy about 15 years ago.  No family history of colon cancer.  No significant upper GI symptoms.  No weight loss.    Current Outpatient Medications  Medication Sig Dispense Refill  . amLODipine (NORVASC) 10 MG tablet Take 10 mg by mouth daily.    Marland Kitchen ibuprofen (ADVIL,MOTRIN) 200 MG tablet Take 400 mg by mouth every 6 (six) hours as needed for mild pain or moderate pain.     Marland Kitchen olmesartan (BENICAR) 5 MG tablet Take 5 mg by mouth daily.  1   No current facility-administered medications for this visit.     Past Medical History:  Diagnosis Date  . Hypertension   . Nephrolithiasis   . Renal disorder    kidney stones 07-08    Past Surgical History:  Procedure Laterality Date  . CHOLECYSTECTOMY N/A 09/01/2015   Procedure: LAPAROSCOPIC CHOLECYSTECTOMY;  Surgeon: Aviva Signs, MD;  Location: [facility];  Service: General;  Laterality: N/A;    Family History  Problem Relation Age of Onset  . Colon cancer Neg Hx     Social History   Socioeconomic History  . Marital status: Married  Spouse name: Not on file  . Number of children: Not on file  . Years of education: Not on file  . Highest education level: Not on file  Occupational History  . Not on file  Social Needs  . Financial resource strain: Not on file  . Food insecurity:    Worry: Not on file    Inability: Not on file  . Transportation needs:    Medical: Not on file    Non-medical: Not on file  Tobacco Use  . Smoking status: Never Smoker  . Smokeless tobacco:  Never Used  Substance and Sexual Activity  . Alcohol use: No    Comment: Last use 2015.  Never heavy  . Drug use: No  . Sexual activity: Not on file  Lifestyle  . Physical activity:    Days per week: Not on file    Minutes per session: Not on file  . Stress: Not on file  Relationships  . Social connections:    Talks on phone: Not on file    Gets together: Not on file    Attends religious service: Not on file    Active member of club or organization: Not on file    Attends meetings of clubs or organizations: Not on file    Relationship status: Not on file  . Intimate partner violence:    Fear of current or ex partner: Not on file    Emotionally abused: Not on file    Physically abused: Not on file    Forced sexual activity: Not on file  Other Topics Concern  . Not on file  Social History Narrative  . Not on file      ROS:  General: Negative for anorexia, weight loss, fever, chills, fatigue, weakness. Eyes: Negative for vision changes.  ENT: Negative for hoarseness, difficulty swallowing , nasal congestion. CV: Negative for chest pain, angina, palpitations, dyspnea on exertion, peripheral edema.  Respiratory: Negative for dyspnea at rest, dyspnea on exertion, cough, sputum, wheezing.  GI: See history of present illness. GU:  Negative for dysuria, hematuria, urinary incontinence, urinary frequency, nocturnal urination.  MS: Negative for joint pain.chronic low back pain.  Derm: Negative for rash or itching.  Neuro: Negative for weakness, abnormal sensation, seizure, frequent headaches, memory loss, confusion.  Psych: Negative for anxiety, depression, suicidal ideation, hallucinations.  Endo: Negative for unusual weight change.  Heme: Negative for bruising or bleeding. Allergy: Negative for rash or hives.   Observations/Objective: Pleasant well-nourished well-developed Caucasian male, no acute distress.  Otherwise exam unavailable.  Labs from PCP April 24, 2018: White  blood cell count 9900, hemoglobin 16, hematocrit 47.4, platelets 267,000, glucose 92, BUN 13, creatinine 1.13, albumin 4.3, total bilirubin 0.8, alkaline phosphatase 63, AST 20, ALT 18  Assessment and Plan: Pleasant 63 year old gentleman presenting to schedule colonoscopy.  He has had some lower abdominal pain, diagnosed with diverticulitis in February.  Abnormal sigmoid colon on scan, suggested colonoscopy.  Clinically diverticulitis has resolved.  He continues to have alternating constipation and diarrhea, some rectal discomfort possibly due to benign anorectal disease. Recommend colonoscopy in the near future to further evaluate abnormal CT finding of sigmoid colon, change in bowel habits.  I have discussed the risks, alternatives, benefits with regards to but not limited to the risk of reaction to medication, bleeding, infection, perforation and the patient is agreeable to proceed. Written consent to be obtained.   Follow Up Instructions:    I discussed the assessment and treatment plan with the patient.  The patient was provided an opportunity to ask questions and all were answered. The patient agreed with the plan and demonstrated an understanding of the instructions. AVS mailed to patient's home address.   The patient was advised to call back or seek an in-person evaluation if the symptoms worsen or if the condition fails to improve as anticipated.  I provided 15 minutes of virtual face-to-face time during this encounter.   Neil Crouch, PA-C

## 2018-07-06 NOTE — Progress Notes (Signed)
CC'D TO PCP °

## 2018-07-18 ENCOUNTER — Telehealth: Payer: Self-pay | Admitting: *Deleted

## 2018-07-18 NOTE — Telephone Encounter (Signed)
Patient called back. He stated to leave him on for now and he will see when they call him to go for testing. He states if he needs to cancel he will let me know. But for now will keep on schedule.

## 2018-07-18 NOTE — Telephone Encounter (Signed)
Spoke with patient and he is aware he will be receiving a call from endo to arrange COVID-19 Testing and he will need to remain quarantined after the test until after his procedure. He is aware if this is not done then is procedure will be cancelled. He stated he is unable to do this. 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  I spoke with JL and this is protocol and if he is unable to have testing done and stay quarantined until after testing until after procedure then procedure will need to be cancelled.   Spoke with patient again and advised him of this again. He stated to cancel for now until this blows over and then he can r/s.

## 2018-07-19 NOTE — Telephone Encounter (Signed)
Endo scheduler called office, she called pt to schedule COVID test. He is unable to be quarantined from after testing until procedure because he has to work. He will call office to reschedule.  Tried to call pt to see if something can be worked out, no answer, LMOVM for return call.

## 2018-07-19 NOTE — Telephone Encounter (Signed)
Yes, lets do quick test

## 2018-07-19 NOTE — Telephone Encounter (Signed)
Patient called back. 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 Patient states he can for 1-2 days but no longer. LSL wanted patient to be done ASAP d/t abnormal sigmoid on CT and change in bowels. Please advise if patient can have "quick test".

## 2018-07-20 ENCOUNTER — Other Ambulatory Visit: Payer: Self-pay

## 2018-07-20 MED ORDER — PEG 3350-KCL-NA BICARB-NACL 420 G PO SOLR: 4000.0000 mL | ORAL | 0 refills | Status: AC

## 2018-07-20 NOTE — Telephone Encounter (Signed)
Patient called back and confirmed he did receive his instructions. He requested we resend rx to pharmacy. rx sent

## 2018-07-20 NOTE — Telephone Encounter (Addendum)
Spoke with carolyn in endo. Patient will go for rapid covid-19 testing on 5/19 at 2:25pm. Patient will need to go straight home after testing and stay until after procedure is done.  Called patient and made aware we will do rapid covid-testing and he needs to arrive 5/19 at 2:25pm. Patient aware he is not to get out of his car and signs are up for entering/exiting. He voiced understanding. Discussed new instructions with patient. His new procedure time is 2:15pm and will need to arrive 1:15pm. He asked if this could be emailed to him at [email or web address].

## 2018-07-25 ENCOUNTER — Other Ambulatory Visit (HOSPITAL_COMMUNITY)
Admission: RE | Admit: 2018-07-25 | Discharge: 2018-07-25 | Disposition: A | Discharge status: Home / Self Care | Payer: HRSA Program | Source: Ambulatory Visit | Attending: Internal Medicine | Admitting: Internal Medicine

## 2018-07-25 ENCOUNTER — Other Ambulatory Visit: Payer: Self-pay

## 2018-07-25 ENCOUNTER — Other Ambulatory Visit (HOSPITAL_COMMUNITY): Payer: Self-pay

## 2018-07-25 DIAGNOSIS — Z1159 Encounter for screening for other viral diseases: Secondary | ICD-10-CM | POA: Insufficient documentation

## 2018-07-25 LAB — SARS CORONAVIRUS 2 BY RT PCR (HOSPITAL ORDER, PERFORMED IN ~~LOC~~ HOSPITAL LAB): SARS Coronavirus 2: NEGATIVE

## 2018-07-26 ENCOUNTER — Encounter (HOSPITAL_COMMUNITY)
Admission: RE | Disposition: A | Discharge status: Home / Self Care | Payer: Self-pay | Source: Home / Self Care | Attending: Internal Medicine

## 2018-07-26 ENCOUNTER — Ambulatory Visit (HOSPITAL_COMMUNITY)
Admission: RE | Admit: 2018-07-26 | Discharge: 2018-07-26 | Disposition: A | Discharge status: Home / Self Care | Payer: Self-pay | Attending: Internal Medicine | Admitting: Internal Medicine

## 2018-07-26 ENCOUNTER — Encounter (HOSPITAL_COMMUNITY): Payer: Self-pay | Admitting: *Deleted

## 2018-07-26 DIAGNOSIS — K640 First degree hemorrhoids: Secondary | ICD-10-CM | POA: Insufficient documentation

## 2018-07-26 DIAGNOSIS — D120 Benign neoplasm of cecum: Secondary | ICD-10-CM | POA: Insufficient documentation

## 2018-07-26 DIAGNOSIS — K635 Polyp of colon: Secondary | ICD-10-CM

## 2018-07-26 DIAGNOSIS — Z8719 Personal history of other diseases of the digestive system: Secondary | ICD-10-CM

## 2018-07-26 DIAGNOSIS — D124 Benign neoplasm of descending colon: Secondary | ICD-10-CM | POA: Insufficient documentation

## 2018-07-26 DIAGNOSIS — R933 Abnormal findings on diagnostic imaging of other parts of digestive tract: Secondary | ICD-10-CM | POA: Insufficient documentation

## 2018-07-26 DIAGNOSIS — R194 Change in bowel habit: Secondary | ICD-10-CM

## 2018-07-26 DIAGNOSIS — K5730 Diverticulosis of large intestine without perforation or abscess without bleeding: Secondary | ICD-10-CM | POA: Insufficient documentation

## 2018-07-26 DIAGNOSIS — I10 Essential (primary) hypertension: Secondary | ICD-10-CM | POA: Insufficient documentation

## 2018-07-26 HISTORY — PX: COLONOSCOPY: SHX5424

## 2018-07-26 HISTORY — PX: POLYPECTOMY: SHX5525

## 2018-07-26 SURGERY — COLONOSCOPY
Anesthesia: Moderate Sedation

## 2018-07-26 MED ORDER — MEPERIDINE HCL 100 MG/ML IJ SOLN
INTRAMUSCULAR | Status: DC | PRN
  Administered 2018-07-26: 15 mg
  Administered 2018-07-26: 25 mg

## 2018-07-26 MED ORDER — MIDAZOLAM HCL 5 MG/5ML IJ SOLN
INTRAMUSCULAR | Status: AC
  Filled 2018-07-26: qty 10

## 2018-07-26 MED ORDER — ONDANSETRON HCL 4 MG/2ML IJ SOLN
INTRAMUSCULAR | Status: DC | PRN
  Administered 2018-07-26: 4 mg via INTRAVENOUS

## 2018-07-26 MED ORDER — ONDANSETRON HCL 4 MG/2ML IJ SOLN
INTRAMUSCULAR | Status: AC
  Filled 2018-07-26: qty 2

## 2018-07-26 MED ORDER — SODIUM CHLORIDE 0.9 % IV SOLN
INTRAVENOUS | Status: DC
  Administered 2018-07-26: 14:00:00 via INTRAVENOUS

## 2018-07-26 MED ORDER — LIDOCAINE HCL URETHRAL/MUCOSAL 2 % EX GEL
CUTANEOUS | Status: AC
  Filled 2018-07-26: qty 30

## 2018-07-26 MED ORDER — MIDAZOLAM HCL 5 MG/5ML IJ SOLN
INTRAMUSCULAR | Status: DC | PRN
  Administered 2018-07-26: 2 mg via INTRAVENOUS
  Administered 2018-07-26 (×2): 1 mg via INTRAVENOUS
  Administered 2018-07-26: 2 mg via INTRAVENOUS

## 2018-07-26 MED ORDER — MEPERIDINE HCL 50 MG/ML IJ SOLN
INTRAMUSCULAR | Status: AC
  Filled 2018-07-26: qty 1

## 2018-07-26 NOTE — Op Note (Signed)
[facility] Patient Name: [name] Procedure Date: 07/26/2018 1:54 PM MRN: [id number] Date of Birth: Dec 19, 1955 Attending MD: Norvel Richards , MD CSN: [id number] Age: 63 Admit Type: Outpatient Procedure:                Colonoscopy Indications:              Abnormal CT of the GI tract Providers:                Norvel Richards, MD, Janeece Riggers, RN, Raphael Gibney, Technician, Randa Spike, Technician Referring MD:              Medicines:                Midazolam 6 mg IV, Meperidine 40 mg IV, Ondansetron                            4 mg IV Complications:            No immediate complications. Estimated Blood Loss:     Estimated blood loss was minimal. Procedure:                Pre-Anesthesia Assessment:                           - Prior to the procedure, a History and Physical                            was performed, and patient medications and                            allergies were reviewed. The patient's tolerance of                            previous anesthesia was also reviewed. The risks                            and benefits of the procedure and the sedation                            options and risks were discussed with the patient.                            All questions were answered, and informed consent                            was obtained. Prior Anticoagulants: The patient has                            taken no previous anticoagulant or antiplatelet                            agents. ASA Grade Assessment: II - A patient with  mild systemic disease. After reviewing the risks                            and benefits, the patient was deemed in                            satisfactory condition to undergo the procedure.                           After obtaining informed consent, the colonoscope                            was passed under direct vision. Throughout the                             procedure, the patient's blood pressure, pulse, and                            oxygen saturations were monitored continuously. The                            CF-[id number] ([id number]) scope was introduced through                            the anus and advanced to the the cecum, identified                            by appendiceal orifice and ileocecal valve. The                            colonoscopy was performed without difficulty. The                            patient tolerated the procedure well. The quality                            of the bowel preparation was adequate. The entire                            colon was well visualized. Scope In: 2:22:17 PM Scope Out: 2:42:08 PM Scope Withdrawal Time: 0 hours 11 minutes 5 seconds  Total Procedure Duration: 0 hours 19 minutes 51 seconds  Findings:      The perianal and digital rectal examinations were normal.      Two sessile polyps were found in the descending colon and cecum. The       polyps were 6 to 8 mm in size. These polyps were removed with a cold       snare. Resection and retrieval were complete. Estimated blood loss was       minimal.      Scattered medium-mouthed diverticula were found in the entire colon.      Non-bleeding internal hemorrhoids were found during retroflexion. The       hemorrhoids were moderate, medium-sized and Grade I (internal       hemorrhoids that do not prolapse).  No additional abnormalities were found on retroflexion. Impression:               - Two 6 to 8 mm polyps in the descending colon and                            in the cecum, removed with a cold snare. Resected                            and retrieved.                           - Diverticulosis in the entire examined colon.                           - Non-bleeding internal hemorrhoids. Moderate Sedation:      Moderate (conscious) sedation was administered by the endoscopy nurse       and supervised by the endoscopist. The following  parameters were       monitored: oxygen saturation, heart rate, blood pressure, respiratory       rate, EKG, adequacy of pulmonary ventilation, and response to care.       Total physician intraservice time was 31 minutes. Recommendation:           - Patient has a contact number available for                            emergencies. The signs and symptoms of potential                            delayed complications were discussed with the                            patient. Return to normal activities tomorrow.                            Written discharge instructions were provided to the                            patient.                           - Advance diet as tolerated. Begin Benefiber 1                            tablespoon daily for 3 weeks; then increase to 1                            tablespoon twice daily thereafter indefinitely.                           - Continue present medications.                           - Repeat colonoscopy date to be determined after  pending pathology results are reviewed for                            surveillance based on pathology results.                           - Return to GI office (date not yet determined). Procedure Code(s):        --- Professional ---                           [phone], Colonoscopy, flexible; with removal of                            tumor(s), polyp(s), or other lesion(s) by snare                            technique                           99153, Moderate sedation; each additional 15                            minutes intraservice time                           G0500, Moderate sedation services provided by the                            same physician or other qualified health care                            professional performing a gastrointestinal                            endoscopic service that sedation supports,                            requiring the presence of an independent trained                             observer to assist in the monitoring of the                            patient's level of consciousness and physiological                            status; initial 15 minutes of intra-service time;                            patient age 37 years or older (additional time may                            be reported with [phone], as appropriate) Diagnosis Code(s):        --- Professional ---  K63.5, Polyp of colon                           K64.0, First degree hemorrhoids                           K57.30, Diverticulosis of large intestine without                            perforation or abscess without bleeding                           R93.3, Abnormal findings on diagnostic imaging of                            other parts of digestive tract CPT copyright 2019 [employer]. All rights reserved. The codes documented in this report are preliminary and upon coder review may  be revised to meet current compliance requirements. Cristopher Estimable. Odilon Cass, MD Norvel Richards, MD 07/26/2018 2:52:29 PM This report has been signed electronically. Number of Addenda: 0

## 2018-07-26 NOTE — Interval H&P Note (Signed)
History and Physical Interval Note:  07/26/2018 2:00 PM  [name]  has presented today for surgery, with the diagnosis of abnormal sigmoid colon on CT, history of diverticulitis, change bowel habits.  The various methods of treatment have been discussed with the patient and family. After consideration of risks, benefits and other options for treatment, the patient has consented to  Procedure(s) with comments: COLONOSCOPY (N/A) - 12:30pm as a surgical intervention.  The patient's history has been reviewed, patient examined, no change in status, stable for surgery.  I have reviewed the patient's chart and labs.  Questions were answered to the patient's satisfaction.     Robert Rourk   No change.  Diagnostic colonoscopy per plan.  The risks, benefits, limitations, alternatives and imponderables have been reviewed with the patient. Questions have been answered. All parties are agreeable.

## 2018-07-26 NOTE — Discharge Instructions (Signed)
Colonoscopy Discharge Instructions  Read the instructions outlined below and refer to this sheet in the next few weeks. These discharge instructions provide you with general information on caring for yourself after you leave the hospital. Your doctor may also give you specific instructions. While your treatment has been planned according to the most current medical practices available, unavoidable complications occasionally occur. If you have any problems or questions after discharge, call Dr. Gala Romney at [phone]. ACTIVITY  You may resume your regular activity, but move at a slower pace for the next 24 hours.   Take frequent rest periods for the next 24 hours.   Walking will help get rid of the air and reduce the bloated feeling in your belly (abdomen).   No driving for 24 hours (because of the medicine (anesthesia) used during the test).    Do not sign any important legal documents or operate any machinery for 24 hours (because of the anesthesia used during the test).  NUTRITION  Drink plenty of fluids.   You may resume your normal diet as instructed by your doctor.   Begin with a light meal and progress to your normal diet. Heavy or fried foods are harder to digest and may make you feel sick to your stomach (nauseated).   Avoid alcoholic beverages for 24 hours or as instructed.  MEDICATIONS  You may resume your normal medications unless your doctor tells you otherwise.  WHAT YOU CAN EXPECT TODAY  Some feelings of bloating in the abdomen.   Passage of more gas than usual.   Spotting of blood in your stool or on the toilet paper.  IF YOU HAD POLYPS REMOVED DURING THE COLONOSCOPY:  No aspirin products for 7 days or as instructed.   No alcohol for 7 days or as instructed.   Eat a soft diet for the next 24 hours.  FINDING OUT THE RESULTS OF YOUR TEST Not all test results are available during your visit. If your test results are not back during the visit, make an appointment  with your caregiver to find out the results. Do not assume everything is normal if you have not heard from your caregiver or the medical facility. It is important for you to follow up on all of your test results.  SEEK IMMEDIATE MEDICAL ATTENTION IF:  You have more than a spotting of blood in your stool.   Your belly is swollen (abdominal distention).   You are nauseated or vomiting.   You have a temperature over 101.   You have abdominal pain or discomfort that is severe or gets worse throughout the day.   Hemorrhoid, diverticulosis and colon polyp information provided   Begin Benefiber 1 tablespoon daily for 3 weeks; then increase to twice daily thereafter indefinitely   Further recommendations to follow pending review of pathology report.     Hemorrhoids Hemorrhoids are swollen veins in and around the rectum or anus. There are two types of hemorrhoids:  Internal hemorrhoids. These occur in the veins that are just inside the rectum. They may poke through to the outside and become irritated and painful.  External hemorrhoids. These occur in the veins that are outside the anus and can be felt as a painful swelling or hard lump near the anus. Most hemorrhoids do not cause serious problems, and they can be managed with home treatments such as diet and lifestyle changes. If home treatments do not help the symptoms, procedures can be done to shrink or remove the hemorrhoids. What are  the causes? This condition is caused by increased pressure in the anal area. This pressure may result from various things, including:  Constipation.  Straining to have a bowel movement.  Diarrhea.  Pregnancy.  Obesity.  Sitting for long periods of time.  Heavy lifting or other activity that causes you to strain.  Anal sex.  Riding a bike for a long period of time. What are the signs or symptoms? Symptoms of this condition include:  Pain.  Anal itching or irritation.  Rectal  bleeding.  Leakage of stool (feces).  Anal swelling.  One or more lumps around the anus. How is this diagnosed? This condition can often be diagnosed through a visual exam. Other exams or tests may also be done, such as:  An exam that involves feeling the rectal area with a gloved hand (digital rectal exam).  An exam of the anal canal that is done using a small tube (anoscope).  A blood test, if you have lost a significant amount of blood.  A test to look inside the colon using a flexible tube with a camera on the end (sigmoidoscopy or colonoscopy). How is this treated? This condition can usually be treated at home. However, various procedures may be done if dietary changes, lifestyle changes, and other home treatments do not help your symptoms. These procedures can help make the hemorrhoids smaller or remove them completely. Some of these procedures involve surgery, and others do not. Common procedures include:  Rubber band ligation. Rubber bands are placed at the base of the hemorrhoids to cut off their blood supply.  Sclerotherapy. Medicine is injected into the hemorrhoids to shrink them.  Infrared coagulation. A type of light energy is used to get rid of the hemorrhoids.  Hemorrhoidectomy surgery. The hemorrhoids are surgically removed, and the veins that supply them are tied off.  Stapled hemorrhoidopexy surgery. The surgeon staples the base of the hemorrhoid to the rectal wall. Follow these instructions at home: Eating and drinking   Eat foods that have a lot of fiber in them, such as whole grains, beans, nuts, fruits, and vegetables.  Ask your health care provider about taking products that have added fiber (fiber supplements).  Reduce the amount of fat in your diet. You can do this by eating low-fat dairy products, eating less red meat, and avoiding processed foods.  Drink enough fluid to keep your urine pale yellow. Managing pain and swelling   Take warm sitz baths  for 20 minutes, 3-4 times a day to ease pain and discomfort. You may do this in a bathtub or using a portable sitz bath that fits over the toilet.  If directed, apply ice to the affected area. Using ice packs between sitz baths may be helpful. ? Put ice in a plastic bag. ? Place a towel between your skin and the bag. ? Leave the ice on for 20 minutes, 2-3 times a day. General instructions  Take over-the-counter and prescription medicines only as told by your health care provider.  Use medicated creams or suppositories as told.  Get regular exercise. Ask your health care provider how much and what kind of exercise is best for you. In general, you should do moderate exercise for at least 30 minutes on most days of the week (150 minutes each week). This can include activities such as walking, biking, or yoga.  Go to the bathroom when you have the urge to have a bowel movement. Do not wait.  Avoid straining to have bowel movements.  Keep the anal area dry and clean. Use wet toilet paper or moist towelettes after a bowel movement.  Do not sit on the toilet for long periods of time. This increases blood pooling and pain.  Keep all follow-up visits as told by your health care provider. This is important. Contact a health care provider if you have:  Increasing pain and swelling that are not controlled by treatment or medicine.  Difficulty having a bowel movement, or you are unable to have a bowel movement.  Pain or inflammation outside the area of the hemorrhoids. Get help right away if you have:  Uncontrolled bleeding from your rectum. Summary  Hemorrhoids are swollen veins in and around the rectum or anus.  Most hemorrhoids can be managed with home treatments such as diet and lifestyle changes.  Taking warm sitz baths can help ease pain and discomfort.  In severe cases, procedures or surgery can be done to shrink or remove the hemorrhoids. This information is not intended to  replace advice given to you by your health care provider. Make sure you discuss any questions you have with your health care provider. Document Released: 02/20/2000 Document Revised: 07/14/2017 Document Reviewed: 07/14/2017 Elsevier Interactive Patient Education  2019 [employer].     Diverticulosis  Diverticulosis is a condition that develops when small pouches (diverticula) form in the wall of the large intestine (colon). The colon is where water is absorbed and stool is formed. The pouches form when the inside layer of the colon pushes through weak spots in the outer layers of the colon. You may have a few pouches or many of them. What are the causes? The cause of this condition is not known. What increases the risk? The following factors may make you more likely to develop this condition:  Being older than age 71. Your risk for this condition increases with age. Diverticulosis is rare among people younger than age 53. By age 31, many people have it.  Eating a low-fiber diet.  Having frequent constipation.  Being overweight.  Not getting enough exercise.  Smoking.  Taking over-the-counter pain medicines, like aspirin and ibuprofen.  Having a family history of diverticulosis. What are the signs or symptoms? In most people, there are no symptoms of this condition. If you do have symptoms, they may include:  Bloating.  Cramps in the abdomen.  Constipation or diarrhea.  Pain in the lower left side of the abdomen. How is this diagnosed? This condition is most often diagnosed during an exam for other colon problems. Because diverticulosis usually has no symptoms, it often cannot be diagnosed independently. This condition may be diagnosed by:  Using a flexible scope to examine the colon (colonoscopy).  Taking an X-ray of the colon after dye has been put into the colon (barium enema).  Doing a CT scan. How is this treated? You may not need treatment for this condition if  you have never developed an infection related to diverticulosis. If you have had an infection before, treatment may include:  Eating a high-fiber diet. This may include eating more fruits, vegetables, and grains.  Taking a fiber supplement.  Taking a live bacteria supplement (probiotic).  Taking medicine to relax your colon.  Taking antibiotic medicines. Follow these instructions at home:  Drink 6-8 glasses of water or more each day to prevent constipation.  Try not to strain when you have a bowel movement.  If you have had an infection before: ? Eat more fiber as directed by your health  care provider or your diet and nutrition specialist (dietitian). ? Take a fiber supplement or probiotic, if your health care provider approves.  Take over-the-counter and prescription medicines only as told by your health care provider.  If you were prescribed an antibiotic, take it as told by your health care provider. Do not stop taking the antibiotic even if you start to feel better.  Keep all follow-up visits as told by your health care provider. This is important. Contact a health care provider if:  You have pain in your abdomen.  You have bloating.  You have cramps.  You have not had a bowel movement in 3 days. Get help right away if:  Your pain gets worse.  Your bloating becomes very bad.  You have a fever or chills, and your symptoms suddenly get worse.  You vomit.  You have bowel movements that are bloody or black.  You have bleeding from your rectum. Summary  Diverticulosis is a condition that develops when small pouches (diverticula) form in the wall of the large intestine (colon).  You may have a few pouches or many of them.  This condition is most often diagnosed during an exam for other colon problems.  If you have had an infection related to diverticulosis, treatment may include increasing the fiber in your diet, taking supplements, or taking medicines. This  information is not intended to replace advice given to you by your health care provider. Make sure you discuss any questions you have with your health care provider. Document Released: 11/20/2003 Document Revised: 01/12/2016 Document Reviewed: 01/12/2016 Elsevier Interactive Patient Education  2019 [employer].    Colon Polyps  Polyps are tissue growths inside the body. Polyps can grow in many places, including the large intestine (colon). A polyp may be a round bump or a mushroom-shaped growth. You could have one polyp or several. Most colon polyps are noncancerous (benign). However, some colon polyps can become cancerous over time. Finding and removing the polyps early can help prevent this. What are the causes? The exact cause of colon polyps is not known. What increases the risk? You are more likely to develop this condition if you:  Have a family history of colon cancer or colon polyps.  Are older than 40 or older than 45 if you are African American.  Have inflammatory bowel disease, such as ulcerative colitis or Crohn's disease.  Have certain hereditary conditions, such as: ? Familial adenomatous polyposis. ? Lynch syndrome. ? Turcot syndrome. ? Peutz-Jeghers syndrome.  Are overweight.  Smoke cigarettes.  Do not get enough exercise.  Drink too much alcohol.  Eat a diet that is high in fat and red meat and low in fiber.  Had childhood cancer that was treated with abdominal radiation. What are the signs or symptoms? Most polyps do not cause symptoms. If you have symptoms, they may include:  Blood coming from your rectum when having a bowel movement.  Blood in your stool. The stool may look dark red or black.  Abdominal pain.  A change in bowel habits, such as constipation or diarrhea. How is this diagnosed? This condition is diagnosed with a colonoscopy. This is a procedure in which a lighted, flexible scope is inserted into the anus and then passed into the  colon to examine the area. Polyps are sometimes found when a colonoscopy is done as part of routine cancer screening tests. How is this treated? Treatment for this condition involves removing any polyps that are found. Most polyps can  be removed during a colonoscopy. Those polyps will then be tested for cancer. Additional treatment may be needed depending on the results of testing. Follow these instructions at home: Lifestyle  Maintain a healthy weight, or lose weight if recommended by your health care provider.  Exercise every day or as told by your health care provider.  Do not use any products that contain nicotine or tobacco, such as cigarettes and e-cigarettes. If you need help quitting, ask your health care provider.  If you drink alcohol, limit how much you have: ? 0-1 drink a day for women. ? 0-2 drinks a day for men.  Be aware of how much alcohol is in your drink. In the [place] one drink equals one 12 oz bottle of beer (355 mL), one 5 oz glass of wine (148 mL), or one 1 oz shot of hard liquor (44 mL). Eating and drinking   Eat foods that are high in fiber, such as fruits, vegetables, and whole grains.  Eat foods that are high in calcium and vitamin D, such as milk, cheese, yogurt, eggs, liver, fish, and broccoli.  Limit foods that are high in fat, such as fried foods and desserts.  Limit the amount of red meat and processed meat you eat, such as hot dogs, sausage, bacon, and lunch meats. General instructions  Keep all follow-up visits as told by your health care provider. This is important. ? This includes having regularly scheduled colonoscopies. ? Talk to your health care provider about when you need a colonoscopy. Contact a health care provider if:  You have new or worsening bleeding during a bowel movement.  You have new or increased blood in your stool.  You have a change in bowel habits.  You lose weight for no known reason. Summary  Polyps are tissue growths  inside the body. Polyps can grow in many places, including the colon.  Most colon polyps are noncancerous (benign), but some can become cancerous over time.  This condition is diagnosed with a colonoscopy.  Treatment for this condition involves removing any polyps that are found. Most polyps can be removed during a colonoscopy. This information is not intended to replace advice given to you by your health care provider. Make sure you discuss any questions you have with your health care provider. Document Released: 11/19/2003 Document Revised: 06/09/2017 Document Reviewed: 06/09/2017 Elsevier Interactive Patient Education  2019 [employer].

## 2018-08-01 ENCOUNTER — Encounter (HOSPITAL_COMMUNITY): Payer: Self-pay | Admitting: Internal Medicine

## 2018-08-02 ENCOUNTER — Encounter: Payer: Self-pay | Admitting: Internal Medicine

## 2019-11-08 ENCOUNTER — Other Ambulatory Visit: Payer: Self-pay

## 2019-11-08 ENCOUNTER — Encounter (HOSPITAL_COMMUNITY): Payer: Self-pay | Admitting: Emergency Medicine

## 2019-11-08 DIAGNOSIS — U071 COVID-19: Secondary | ICD-10-CM | POA: Insufficient documentation

## 2019-11-08 DIAGNOSIS — M26629 Arthralgia of temporomandibular joint, unspecified side: Secondary | ICD-10-CM | POA: Insufficient documentation

## 2019-11-08 DIAGNOSIS — M549 Dorsalgia, unspecified: Secondary | ICD-10-CM | POA: Insufficient documentation

## 2019-11-08 DIAGNOSIS — I10 Essential (primary) hypertension: Secondary | ICD-10-CM | POA: Insufficient documentation

## 2019-11-08 DIAGNOSIS — Z79899 Other long term (current) drug therapy: Secondary | ICD-10-CM | POA: Insufficient documentation

## 2019-11-08 DIAGNOSIS — Z8616 Personal history of COVID-19: Secondary | ICD-10-CM | POA: Insufficient documentation

## 2019-11-08 NOTE — ED Triage Notes (Addendum)
Pt has been suffering from flu like symptoms for over two weeks. Pt has been treated with steriods and varies other meds by Dr. Nevada Crane. Pt is here because his fever at home was 102 orally. Last motrin taken at 1600. 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

## 2019-11-09 ENCOUNTER — Emergency Department (HOSPITAL_COMMUNITY): Payer: Self-pay

## 2019-11-09 ENCOUNTER — Emergency Department (HOSPITAL_COMMUNITY)
Admission: EM | Admit: 2019-11-09 | Discharge: 2019-11-09 | Disposition: A | Discharge status: Home / Self Care | Payer: Self-pay | Attending: Emergency Medicine | Admitting: Emergency Medicine

## 2019-11-09 DIAGNOSIS — U071 COVID-19: Secondary | ICD-10-CM

## 2019-11-09 LAB — COMPREHENSIVE METABOLIC PANEL
ALT: 42 U/L (ref 0–44)
AST: 26 U/L (ref 15–41)
Albumin: 3.4 g/dL — ABNORMAL LOW (ref 3.5–5.0)
Alkaline Phosphatase: 56 U/L (ref 38–126)
Anion gap: 10 (ref 5–15)
BUN: 19 mg/dL (ref 8–23)
CO2: 23 mmol/L (ref 22–32)
Calcium: 8.7 mg/dL — ABNORMAL LOW (ref 8.9–10.3)
Chloride: 105 mmol/L (ref 98–111)
Creatinine, Ser: 1 mg/dL (ref 0.61–1.24)
GFR calc Af Amer: >60 mL/min (ref >60)
GFR calc non Af Amer: >60 mL/min (ref >60)
Glucose, Bld: 130 mg/dL — ABNORMAL HIGH (ref 70–99)
Potassium: 4.2 mmol/L (ref 3.5–5.1)
Sodium: 138 mmol/L (ref 135–145)
Total Bilirubin: 0.5 mg/dL (ref 0.3–1.2)
Total Protein: 7.2 g/dL (ref 6.5–8.1)

## 2019-11-09 LAB — CBC WITH DIFFERENTIAL/PLATELET
Abs Immature Granulocytes: 0.05 10*3/uL (ref 0.00–0.07)
Basophils Absolute: 0 10*3/uL (ref 0.0–0.1)
Basophils Relative: 0 %
Eosinophils Absolute: 0 10*3/uL (ref 0.0–0.5)
Eosinophils Relative: 0 %
HCT: 47.3 % (ref 39.0–52.0)
Hemoglobin: 15.3 g/dL (ref 13.0–17.0)
Immature Granulocytes: 1 %
Lymphocytes Relative: 6 %
Lymphs Abs: 0.5 10*3/uL — ABNORMAL LOW (ref 0.7–4.0)
MCH: 31 pg (ref 26.0–34.0)
MCHC: 32.3 g/dL (ref 30.0–36.0)
MCV: 95.7 fL (ref 80.0–100.0)
Monocytes Absolute: 0.6 10*3/uL (ref 0.1–1.0)
Monocytes Relative: 7 %
Neutro Abs: 7.4 10*3/uL (ref 1.7–7.7)
Neutrophils Relative %: 86 %
Platelets: 187 10*3/uL (ref 150–400)
RBC: 4.94 MIL/uL (ref 4.22–5.81)
RDW: 13.7 % (ref 11.5–15.5)
WBC: 8.6 10*3/uL (ref 4.0–10.5)
nRBC: 0 % (ref 0.0–0.2)

## 2019-11-09 LAB — FERRITIN: Ferritin: 286 ng/mL (ref 24–336)

## 2019-11-09 LAB — PROCALCITONIN: Procalcitonin: <0.1 ng/mL

## 2019-11-09 LAB — TRIGLYCERIDES: Triglycerides: 52 mg/dL (ref <150)

## 2019-11-09 LAB — D-DIMER, QUANTITATIVE: D-Dimer, Quant: 0.33 ug/mL-FEU (ref 0.00–0.50)

## 2019-11-09 LAB — LACTIC ACID, PLASMA
Lactic Acid, Venous: 1.6 mmol/L (ref 0.5–1.9)
Lactic Acid, Venous: 1.4 mmol/L (ref 0.5–1.9)

## 2019-11-09 LAB — SARS CORONAVIRUS 2 BY RT PCR (HOSPITAL ORDER, PERFORMED IN ~~LOC~~ HOSPITAL LAB): SARS Coronavirus 2: POSITIVE — AB

## 2019-11-09 LAB — C-REACTIVE PROTEIN: CRP: 2.4 mg/dL — ABNORMAL HIGH (ref <1.0)

## 2019-11-09 LAB — TROPONIN I (HIGH SENSITIVITY)
Troponin I (High Sensitivity): 6 ng/L (ref <18)
Troponin I (High Sensitivity): 5 ng/L (ref <18)

## 2019-11-09 LAB — FIBRINOGEN: Fibrinogen: 459 mg/dL (ref 210–475)

## 2019-11-09 LAB — LACTATE DEHYDROGENASE: LDH: 170 U/L (ref 98–192)

## 2019-11-09 MED ORDER — SODIUM CHLORIDE 0.9 % IV SOLN
1200.0000 mg | Freq: Once | INTRAVENOUS | Status: AC
  Administered 2019-11-09: 1200 mg via INTRAVENOUS

## 2019-11-09 MED ORDER — FAMOTIDINE IN NACL 20-0.9 MG/50ML-% IV SOLN: 20.0000 mg | Freq: Once | INTRAVENOUS | Status: DC | PRN

## 2019-11-09 MED ORDER — EPINEPHRINE 0.3 MG/0.3ML IJ SOAJ: 0.3000 mg | Freq: Once | INTRAMUSCULAR | Status: DC | PRN

## 2019-11-09 MED ORDER — ALBUTEROL SULFATE HFA 108 (90 BASE) MCG/ACT IN AERS: 2.0000 | INHALATION_SPRAY | Freq: Once | RESPIRATORY_TRACT | Status: AC | PRN

## 2019-11-09 MED ORDER — ALBUTEROL SULFATE HFA 108 (90 BASE) MCG/ACT IN AERS
INHALATION_SPRAY | RESPIRATORY_TRACT | Status: AC
  Administered 2019-11-09: 2 via RESPIRATORY_TRACT
  Filled 2019-11-09: qty 6.7

## 2019-11-09 MED ORDER — METHYLPREDNISOLONE SODIUM SUCC 125 MG IJ SOLR: 125.0000 mg | Freq: Once | INTRAMUSCULAR | Status: DC | PRN

## 2019-11-09 MED ORDER — DIPHENHYDRAMINE HCL 50 MG/ML IJ SOLN: 50.0000 mg | Freq: Once | INTRAMUSCULAR | Status: DC | PRN

## 2019-11-09 MED ORDER — SODIUM CHLORIDE 0.9 % IV SOLN: INTRAVENOUS | Status: DC | PRN

## 2019-11-09 NOTE — ED Notes (Signed)
Pt ambulated to room from waiting room. Sats 96% on arrival to room.

## 2019-11-09 NOTE — Discharge Instructions (Signed)
Keep your self quarantined at home for a total of 14 days as you are doing.  Use Tylenol or Motrin as needed for aches and fever.  Follow-up with your doctor.  Return to the ED with difficulty breathing, chest pain, or other concerns.

## 2019-11-09 NOTE — ED Provider Notes (Signed)
[facility] EMERGENCY DEPARTMENT Provider Note   CSN: [id number] Arrival date & time: 11/08/19  2123     History Chief Complaint  Patient presents with  . Cough    [name] is a 64 y.o. male.  Patient with history of hypertension.  States he had URI symptoms approximately 3 weeks ago with cough, congestion, runny nose, sore throat and intermittent fevers.  He was treated with antibiotics and steroids by his PCP.  Had a negative Covid test by his report.  States 4 days ago he became sick again with headache, intermittent fevers at night, cough and runny nose.  Comes in tonight with fever up to 102 earlier tonight.  Last dose of Motrin at 4 PM.  Did have the second covid vaccine 6 days ago.  States he had a negative Covid test approximately 10 days ago.  He is currently taking prednisone 40 mg a day and Tessalon Perles for his URI.  He denies any chest pain or shortness of breath.  No abdominal pain, nausea or vomiting.  He does have some low back pain and episodes of diarrhea.  Last dose of Motrin was at 4 PM.  Denies any focal weakness, numbness or tingling.  No bowel or bladder incontinence.  Denies any history of heart problems or lung problems.  The history is provided by the patient.  Cough Associated symptoms: chills, fever, headaches, myalgias, rhinorrhea and sore throat   Associated symptoms: no chest pain and no rash        Past Medical History:  Diagnosis Date  . Hypertension   . Nephrolithiasis   . Renal disorder    kidney stones 07-08    Patient Active Problem List   Diagnosis Date Noted  . Abnormal CT scan, sigmoid colon 07/05/2018  . History of colonic diverticulitis 07/05/2018  . Change in bowel habits 07/05/2018  . Low back pain 11/06/2017  . Symptomatic bradycardia 11/06/2017  . Bradycardia 11/06/2017  . Acute calculous cholecystitis 09/01/2015  . Cholelithiasis with acute cholecystitis with biliary obstruction 09/01/2015  . Acute acalculous  cholecystitis 08/30/2015  . Nephrolithiasis 08/30/2015  . HTN (hypertension) 08/30/2015    Past Surgical History:  Procedure Laterality Date  . CHOLECYSTECTOMY N/A 09/01/2015   Procedure: LAPAROSCOPIC CHOLECYSTECTOMY;  Surgeon: Aviva Signs, MD;  Location: [facility] ORS;  Service: General;  Laterality: N/A;  . COLONOSCOPY N/A 07/26/2018   Procedure: COLONOSCOPY;  Surgeon: Daneil Dolin, MD;  Location: [facility] ENDO SUITE;  Service: Endoscopy;  Laterality: N/A;  12:30pm  . POLYPECTOMY  07/26/2018   Procedure: POLYPECTOMY;  Surgeon: Daneil Dolin, MD;  Location: [facility] END[facility] SUITE;  Service: Endoscopy;;       Family History  Problem Relation Age of Onset  . Colon cancer Neg Hx     Social History   Tobacco Use  . Smoking status: Never Smoker  . Smokeless tobacco: Never Used  Vaping Use  . Vaping Use: Never used  Substance Use Topics  . Alcohol use: No    Comment: Last use 2015.  Never heavy  . Drug use: No    Home Medications Prior to Admission medications   Medication Sig Start Date End Date Taking? Authorizing Provider  amLODipine (NORVASC) 10 MG tablet Take 10 mg by mouth daily. 04/19/18   [provider]  ibuprofen (ADVIL,MOTRIN) 200 MG tablet Take 400 mg by mouth every 6 (six) hours as needed for mild pain or moderate pain.     [provider]  olmesartan [facility])  5 MG tablet Take 5 mg by mouth daily. 10/10/17   [provider]  polyethylene glycol-electrolytes (TRILYTE) 420 g solution Take 4,000 mLs by mouth as directed. 07/20/18   Rourk, Cristopher Estimable, MD    Allergies    Ciprofloxacin  Review of Systems   Review of Systems  Constitutional: Positive for activity change, appetite change, chills, fatigue and fever.  HENT: Positive for congestion, rhinorrhea and sore throat.   Respiratory: Positive for cough.   Cardiovascular: Negative for chest pain and leg swelling.  Gastrointestinal: Positive for diarrhea. Negative for abdominal pain, nausea and vomiting.    Genitourinary: Negative for dysuria and hematuria.  Musculoskeletal: Positive for arthralgias, back pain and myalgias.  Skin: Negative for rash.  Neurological: Positive for weakness and headaches. Negative for dizziness, light-headedness and numbness.   all other systems are negative except as noted in the HPI and PMH.    Physical Exam Updated Vital Signs BP (!) 167/95 (BP Location: Right Arm)   Pulse 75   Temp 99.1 F (37.3 C) (Oral)   Resp 17   Ht 5\' 7"  (1.702 m)   Wt 83.9 kg   SpO2 96%   BMI 28.98 kg/m   Physical Exam Vitals and nursing note reviewed.  Constitutional:      General: He is not in acute distress.    Appearance: He is well-developed. He is not ill-appearing.     Comments: Speaking full sentences, no distress  HENT:     Head: Normocephalic and atraumatic.     Mouth/Throat:     Pharynx: No oropharyngeal exudate.  Eyes:     Conjunctiva/sclera: Conjunctivae normal.     Pupils: Pupils are equal, round, and reactive to light.  Neck:     Comments: No meningismus. Cardiovascular:     Rate and Rhythm: Normal rate and regular rhythm.     Heart sounds: Normal heart sounds. No murmur heard.   Pulmonary:     Effort: Pulmonary effort is normal. No respiratory distress.     Breath sounds: Normal breath sounds.  Chest:     Chest wall: No tenderness.  Abdominal:     Palpations: Abdomen is soft.     Tenderness: There is no abdominal tenderness. There is no guarding or rebound.  Musculoskeletal:        General: Tenderness present. Normal range of motion.     Cervical back: Normal range of motion and neck supple.     Comments: Diffuse thoracic and paraspinal lumbar tenderness, no midline tenderness  5/5 strength in bilateral lower extremities. Ankle plantar and dorsiflexion intact. Great toe extension intact bilaterally. +2 DP and PT pulses. +2 patellar reflexes bilaterally. Normal gait.   Skin:    General: Skin is warm.  Neurological:     Mental Status: He is  alert and oriented to person, place, and time.     Cranial Nerves: No cranial nerve deficit.     Motor: No abnormal muscle tone.     Coordination: Coordination normal.     Comments: No ataxia on finger to nose bilaterally. No pronator drift. 5/5 strength throughout. CN 2-12 intact.Equal grip strength. Sensation intact.   Psychiatric:        Behavior: Behavior normal.     ED Results / Procedures / Treatments   Labs (all labs ordered are listed, but only abnormal results are displayed) Labs Reviewed  SARS CORONAVIRUS 2 BY RT PCR (HOSPITAL ORDER, [facility]) - Abnormal; Notable for the following components:  Result Value   SARS Coronavirus 2 POSITIVE (*)    All other components within normal limits  CBC WITH DIFFERENTIAL/PLATELET - Abnormal; Notable for the following components:   Lymphs Abs 0.5 (*)    All other components within normal limits  COMPREHENSIVE METABOLIC PANEL - Abnormal; Notable for the following components:   Glucose, Bld 130 (*)    Calcium 8.7 (*)    Albumin 3.4 (*)    All other components within normal limits  C-REACTIVE PROTEIN - Abnormal; Notable for the following components:   CRP 2.4 (*)    All other components within normal limits  CULTURE, BLOOD (ROUTINE X 2)  CULTURE, BLOOD (ROUTINE X 2)  LACTIC ACID, PLASMA  LACTIC ACID, PLASMA  D-DIMER, QUANTITATIVE (NOT AT [facility])  PROCALCITONIN  LACTATE DEHYDROGENASE  FERRITIN  TRIGLYCERIDES  FIBRINOGEN  TROPONIN I (HIGH SENSITIVITY)  TROPONIN I (HIGH SENSITIVITY)    EKG EKG Interpretation  Date/Time:  Friday November 09 2019 01:02:21 EDT Ventricular Rate:  66 PR Interval:    QRS Duration: 95 QT Interval:  363 QTC Calculation: 381 R Axis:   71 Text Interpretation: Sinus rhythm No significant change was found Confirmed by Ezequiel Essex [phone]) on 11/09/2019 2:03:02 AM   Radiology DG Chest Port 1 View  Result Date: 11/09/2019 CLINICAL DATA:  Cough and fever, COVID-19  positive EXAM: PORTABLE CHEST 1 VIEW COMPARISON:  11/06/2017 FINDINGS: Two frontal views of the chest demonstrate an unremarkable cardiac silhouette. No airspace disease, effusion, or pneumothorax. No acute bony abnormalities. IMPRESSION: 1. No acute intrathoracic process. Electronically Signed   By: Randa Ngo M.D.   On: 11/09/2019 01:16    Procedures Procedures (including critical care time)  Medications Ordered in ED Medications - No data to display  ED Course  I have reviewed the triage vital signs and the nursing notes.  Pertinent labs & imaging results that were available during my care of the patient were reviewed by me and considered in my medical decision making (see chart for details).    MDM Rules/Calculators/A&P                         4 days of cough, fever, and intermittent shortness of breath.  No hypoxia or increased work of breathing.  Found to be Covid positive.  No chest pain.  Chest x-ray is negative.  EKG is unchanged.  No hypoxia or increased work of breathing.  Inflammatory markers mildly elevated.  CRP 2.4. No increased work of breathing or hypoxia with ambulation Troponin negative. D-dimer negative.  Patient qualifies for monoclonal antibody based on his age and BMI.  He accepts after discussion of risks and benefits.  Patient tolerated antibody infusion without difficulty.  He is tolerating p.o. and ambulatory without desaturation.  Discussed home quarantine precautions and PCP follow-up.  He has antitussives at home.  Continue anti-inflammatories and antipyretics.  No indication for further steroids.  He states he is another 2 or 3 days of steroids at home.  He is not hypoxic.  Denies chest pain or shortness of breath and chest x-ray is clear.  No indication for remdesivir.  Return precautions discussed.  KHALFANI WEIDEMAN was evaluated in Emergency Department on 11/09/2019 for the symptoms described in the history of present illness. He was evaluated in the  context of the global COVID-19 pandemic, which necessitated consideration that the patient might be at risk for infection with the SARS-CoV-2 virus that causes COVID-19. Institutional protocols and algorithms  that pertain to the evaluation of patients at risk for COVID-19 are in a state of rapid change based on information released by regulatory bodies including the CDC and federal and state organizations. These policies and algorithms were followed during the patient's care in the ED.  Final Clinical Impression(s) / ED Diagnoses Final diagnoses:  COVID-19 virus infection    Rx / DC Orders ED Discharge Orders    None       Nataki Mccrumb, Annie Main, MD 11/09/19 [phone]

## 2019-11-14 LAB — CULTURE, BLOOD (ROUTINE X 2)
Culture: NO GROWTH
Culture: NO GROWTH
Special Requests: ADEQUATE

## 2021-05-17 ENCOUNTER — Emergency Department (HOSPITAL_COMMUNITY): Payer: Self-pay

## 2021-05-17 ENCOUNTER — Encounter (HOSPITAL_COMMUNITY): Payer: Self-pay | Admitting: Emergency Medicine

## 2021-05-17 ENCOUNTER — Emergency Department (HOSPITAL_COMMUNITY)
Admission: EM | Admit: 2021-05-17 | Discharge: 2021-05-17 | Disposition: A | Discharge status: Home / Self Care | Payer: Self-pay | Attending: Emergency Medicine | Admitting: Emergency Medicine

## 2021-05-17 ENCOUNTER — Other Ambulatory Visit: Payer: Self-pay

## 2021-05-17 DIAGNOSIS — Z20822 Contact with and (suspected) exposure to covid-19: Secondary | ICD-10-CM | POA: Insufficient documentation

## 2021-05-17 DIAGNOSIS — R0981 Nasal congestion: Secondary | ICD-10-CM | POA: Insufficient documentation

## 2021-05-17 DIAGNOSIS — I10 Essential (primary) hypertension: Secondary | ICD-10-CM | POA: Insufficient documentation

## 2021-05-17 DIAGNOSIS — R051 Acute cough: Secondary | ICD-10-CM | POA: Insufficient documentation

## 2021-05-17 DIAGNOSIS — Z79899 Other long term (current) drug therapy: Secondary | ICD-10-CM | POA: Insufficient documentation

## 2021-05-17 DIAGNOSIS — R1030 Lower abdominal pain, unspecified: Secondary | ICD-10-CM | POA: Insufficient documentation

## 2021-05-17 DIAGNOSIS — J029 Acute pharyngitis, unspecified: Secondary | ICD-10-CM | POA: Insufficient documentation

## 2021-05-17 LAB — RESP PANEL BY RT-PCR (FLU A&B, COVID) ARPGX2
Influenza A by PCR: NEGATIVE
Influenza B by PCR: NEGATIVE
SARS Coronavirus 2 by RT PCR: NEGATIVE

## 2021-05-17 MED ORDER — BENZONATATE 100 MG PO CAPS: 100.0000 mg | ORAL_CAPSULE | Freq: Three times a day (TID) | ORAL | 0 refills | Status: DC

## 2021-05-17 NOTE — Discharge Instructions (Addendum)
Chest x-ray was negative for pneumonia.  Your respiratory panel was negative for COVID and flu.  This is good news.  This is likely a viral infection that needs to run its course and resolve on its own.  You can alternate Tylenol and ibuprofen every 3-4 hours as needed for fever.  I will also give you a prescription for Tessalon Perles for cough.  I would like for you to schedule an appointment with your primary care doctor sometime next week for follow-up.  Return to the emergency department for any worsening symptoms. ?

## 2021-05-17 NOTE — ED Triage Notes (Signed)
Patient states cough and nasal congestion with sputum production and low temp of 99.9 this morning.  ?

## 2021-05-17 NOTE — ED Triage Notes (Signed)
Pt presents today c/o productive cough, sore throat, nasal congestion, body aches since Friday. Reports temp has been 99.4 this morning. Took '800mg'$  of motrin about 1 hours ago. 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 ?

## 2021-05-17 NOTE — ED Provider Notes (Signed)
?[facility] ?Provider Note ? ? ?CSN: [id number] ?Arrival date & time: 05/17/21  1143 ? ?  ? ?History ?Chief Complaint  ?Patient presents with  ? Cough  ? Nasal Congestion  ? ? ?[name] is a 66 y.o. male who presents to the emergency department with 2-day history of cough, congestion, and sore throat.  Patient states fever started last night to 101.  He took Motrin which did relieve it.  Also complaining of mild lower abdominal pain without any nausea, vomiting, or diarrhea. ? ? ?Cough ? ?  ? ?Home Medications ?Prior to Admission medications   ?Medication Sig Start Date End Date Taking? Authorizing Provider  ?amLODipine (NORVASC) 10 MG tablet Take 10 mg by mouth daily. 04/19/18  Yes [provider]  ?benzonatate (TESSALON) 100 MG capsule Take 1 capsule (100 mg total) by mouth every 8 (eight) hours. 05/17/21  Yes Raul Del, Lupe Bonner M, PA-C  ?diphenhydrAMINE (BENADRYL) 25 MG tablet Take 25 mg by mouth every 6 (six) hours as needed.   Yes [provider]  ?ibuprofen (ADVIL,MOTRIN) 200 MG tablet Take 400 mg by mouth every 6 (six) hours as needed for mild pain or moderate pain.    Yes [provider]  ?olmesartan (BENICAR) 5 MG tablet Take 5 mg by mouth daily. 10/10/17  Yes [provider]  ?polyethylene glycol-electrolytes (TRILYTE) 420 g solution Take 4,000 mLs by mouth as directed. ?Patient not taking: Reported on 05/17/2021 07/20/18   Daneil Dolin, MD  ?   ? ?Allergies    ?Ciprofloxacin   ? ?Review of Systems   ?Review of Systems  ?Respiratory:  Positive for cough.   ?All other systems reviewed and are negative. ? ?Physical Exam ?Updated Vital Signs ?BP (!) 163/82   Pulse 73   Temp 99.9 ?F (37.7 ?C) (Oral)   Resp 18   Ht '5\' 7"'$  (1.702 m)   Wt 83.9 kg   SpO2 95%   BMI 28.98 kg/m?  ?Physical Exam ?Vitals and nursing note reviewed.  ?Constitutional:   ?   General: He is not in acute distress. ?   Appearance: Normal appearance.  ?HENT:  ?   Head: Normocephalic  and atraumatic.  ?Eyes:  ?   General:     ?   Right eye: No discharge.     ?   Left eye: No discharge.  ?Cardiovascular:  ?   Comments: Regular rate and rhythm.  S1/S2 are distinct without any evidence of murmur, rubs, or gallops.  Radial pulses are 2+ bilaterally.  Dorsalis pedis pulses are 2+ bilaterally.  No evidence of pedal edema. ?Pulmonary:  ?   Comments: Clear to auscultation bilaterally.  Normal effort.  No respiratory distress.  No evidence of wheezes, rales, or rhonchi heard throughout. ?Abdominal:  ?   General: Abdomen is flat. Bowel sounds are normal. There is no distension.  ?   Tenderness: There is no abdominal tenderness. There is no guarding or rebound.  ?Musculoskeletal:     ?   General: Normal range of motion.  ?   Cervical back: Neck supple.  ?Skin: ?   General: Skin is warm and dry.  ?   Findings: No rash.  ?Neurological:  ?   General: No focal deficit present.  ?   Mental Status: He is alert.  ?Psychiatric:     ?   Mood and Affect: Mood normal.     ?   Behavior: Behavior normal.  ? ? ?ED Results / Procedures /  Treatments   ?Labs ?(all labs ordered are listed, but only abnormal results are displayed) ?Labs Reviewed  ?RESP PANEL BY RT-PCR (FLU A&B, COVID) [id number]  ? ? ?EKG ?None ? ?Radiology ?DG Chest 2 View ? ?Result Date: 05/17/2021 ?CLINICAL DATA:  Productive cough for several days.  Fever. EXAM: CHEST - 2 VIEW COMPARISON:  11/09/2019 FINDINGS: The heart size and mediastinal contours are within normal limits. Both lungs are clear. The visualized skeletal structures are unremarkable. IMPRESSION: No active cardiopulmonary disease. Electronically Signed   By: Marlaine Hind M.D.   On: 05/17/2021 12:52   ? ?Procedures ?Procedures  ? ? ?Medications Ordered in ED ?Medications - No data to display ? ?ED Course/ Medical Decision Making/ A&P ?  ?                        ?Medical Decision Making ?Amount and/or Complexity of Data Reviewed ?Radiology: ordered. ? ? ?This patient presents to the ED for concern  of cough, congestion, and fever, this involves an extensive number of treatment options, and is a complaint that carries with it a high risk of complications and morbidity.  The differential diagnosis includes pneumonia versus URI.  I have a low suspicion for pneumothorax, PTA, RPA. ? ? ?Co morbidities that complicate the patient evaluation ? ?Hypertension ? ? ?Additional history obtained: ? ?Additional history obtained from triage note ? ? ?Lab Tests: ? ?I Ordered, and personally interpreted labs.  The pertinent results include: Respiratory panel which is negative for COVID and flu ? ? ?Imaging Studies ordered: ? ?I ordered imaging studies including chest x-ray ?I independently visualized and interpreted imaging which showed no evidence of pneumonia ?I agree with the radiologist interpretation ? ? ?Cardiac Monitoring: ? ?The patient was maintained on a cardiac monitor.  I personally viewed and interpreted the cardiac monitored which showed an underlying rhythm of: Normal sinus rhythm ? ? ?Problem List / ED Course: ? ?Cough, fever.  This is likely a upper respiratory infection.  Patient does not have a fever here although he did take Motrin prior to arrival.  No evidence of tachycardia and tachypnea to make me think this is sepsis.  Chest x-ray was negative for pneumonia.  We will likely treat him conservatively with over-the-counter remedies.  We can also give him Tessalon Perles for cough.  Patient was educated on results and imaging.  Strict turn precautions were discussed.  He expressed full understanding.  He is safe discharge. ? ? ?Reevaluation: ? ?After the interventions noted above, I reevaluated the patient and found that they have :stayed the same ? ? ?Dispostion: ? ?After consideration of the diagnostic results and the patients response to treatment, I feel that the patent would benefit from outpatient follow-up with his primary care doctor. ? ?Final Clinical Impression(s) / ED Diagnoses ?Final diagnoses:   ?Acute cough  ? ? ?Rx / DC Orders ?ED Discharge Orders   ? ?      Ordered  ?  benzonatate (TESSALON) 100 MG capsule  Every 8 hours       ? 05/17/21 1452  ? ?  ?  ? ?  ? ? ?  ?Hendricks Limes, PA-C ?05/17/21 1455 ? ?  ?[name], MD ?05/17/21 1537 ? ?

## 2022-03-02 ENCOUNTER — Emergency Department (HOSPITAL_COMMUNITY): Payer: Self-pay

## 2022-03-02 ENCOUNTER — Emergency Department (HOSPITAL_COMMUNITY)
Admission: EM | Admit: 2022-03-02 | Discharge: 2022-03-02 | Disposition: A | Discharge status: Home / Self Care | Payer: Self-pay | Attending: Emergency Medicine | Admitting: Emergency Medicine

## 2022-03-02 ENCOUNTER — Encounter (HOSPITAL_COMMUNITY): Payer: Self-pay | Admitting: *Deleted

## 2022-03-02 ENCOUNTER — Other Ambulatory Visit: Payer: Self-pay

## 2022-03-02 DIAGNOSIS — J101 Influenza due to other identified influenza virus with other respiratory manifestations: Secondary | ICD-10-CM | POA: Insufficient documentation

## 2022-03-02 DIAGNOSIS — Z79899 Other long term (current) drug therapy: Secondary | ICD-10-CM | POA: Insufficient documentation

## 2022-03-02 DIAGNOSIS — Z20822 Contact with and (suspected) exposure to covid-19: Secondary | ICD-10-CM | POA: Insufficient documentation

## 2022-03-02 LAB — RESP PANEL BY RT-PCR (RSV, FLU A&B, COVID)  RVPGX2
Influenza A by PCR: POSITIVE — AB
Influenza B by PCR: NEGATIVE
Resp Syncytial Virus by PCR: NEGATIVE
SARS Coronavirus 2 by RT PCR: NEGATIVE

## 2022-03-02 NOTE — Discharge Instructions (Signed)
Your Influenza A test is positive.

## 2022-03-02 NOTE — ED Triage Notes (Signed)
Pt in c/o non productive cough x 4 days worsening yesterday, pt c/o fever, denies n/v/d, pt reports new onset sore throat today, A&O x4

## 2022-03-03 NOTE — ED Provider Notes (Signed)
[facility] EMERGENCY DEPARTMENT Provider Note   CSN: [id number] Arrival date & time: 03/02/22  1405     History  Chief Complaint  Patient presents with   Cough    [name] is a 66 y.o. male.  Patient complains of a cough and congestion he reports he has had fever and body aches.  Patient is concerned that he has pneumonia.  Patient reports he has had symptoms for the past 4 days  The history is provided by the patient. No language interpreter was used.  Cough Cough characteristics:  Productive Sputum characteristics:  Nondescript Chronicity:  New Relieved by:  Nothing Worsened by:  Nothing Ineffective treatments:  Cough suppressants Associated symptoms: rhinorrhea        Home Medications Prior to Admission medications   Medication Sig Start Date End Date Taking? Authorizing Provider  amLODipine (NORVASC) 10 MG tablet Take 10 mg by mouth daily. 04/19/18   [provider]  benzonatate (TESSALON) 100 MG capsule Take 1 capsule (100 mg total) by mouth every 8 (eight) hours. 05/17/21   Hendricks Limes, PA-C  diphenhydrAMINE (BENADRYL) 25 MG tablet Take 25 mg by mouth every 6 (six) hours as needed.    [provider]  ibuprofen (ADVIL,MOTRIN) 200 MG tablet Take 400 mg by mouth every 6 (six) hours as needed for mild pain or moderate pain.     [provider]  olmesartan (BENICAR) 5 MG tablet Take 5 mg by mouth daily. 10/10/17   [provider]  polyethylene glycol-electrolytes (TRILYTE) 420 g solution Take 4,000 mLs by mouth as directed. Patient not taking: Reported on 05/17/2021 07/20/18   Rourk, Jonathan Estimable, MD      Allergies    Ciprofloxacin    Review of Systems   Review of Systems  HENT:  Positive for rhinorrhea.   Respiratory:  Positive for cough.   All other systems reviewed and are negative.   Physical Exam Updated Vital Signs BP (!) 145/82 (BP Location: Right Arm)   Pulse 61   Temp 98.4 F (36.9 C) (Oral)   Resp 17   Ht  '5\' 7"'$  (1.702 m)   Wt 83.9 kg   SpO2 98%   BMI 28.98 kg/m  Physical Exam Vitals and nursing note reviewed.  Constitutional:      Appearance: He is well-developed.  HENT:     Head: Normocephalic.     Mouth/Throat:     Mouth: Mucous membranes are moist.  Eyes:     Pupils: Pupils are equal, round, and reactive to light.  Cardiovascular:     Rate and Rhythm: Normal rate and regular rhythm.  Pulmonary:     Effort: Pulmonary effort is normal.  Abdominal:     General: There is no distension.  Musculoskeletal:        General: Normal range of motion.     Cervical back: Normal range of motion.  Neurological:     General: No focal deficit present.     Mental Status: He is alert and oriented to person, place, and time.  Psychiatric:        Mood and Affect: Mood normal.     ED Results / Procedures / Treatments   Labs (all labs ordered are listed, but only abnormal results are displayed) Labs Reviewed  RESP PANEL BY RT-PCR (RSV, FLU A&B, COVID)  [id number] - Abnormal; Notable for the following components:      Result Value   Influenza A by PCR POSITIVE (*)  All other components within normal limits    EKG None  Radiology DG Chest 2 View  Result Date: 03/02/2022 CLINICAL DATA:  Cough EXAM: CHEST - 2 VIEW COMPARISON:  05/17/2021 FINDINGS: The heart size and mediastinal contours are within normal limits. Both lungs are clear. The visualized skeletal structures are unremarkable. IMPRESSION: No active cardiopulmonary disease. Electronically Signed   By: Inez Catalina M.D.   On: 03/02/2022 17:33    Procedures Procedures    Medications Ordered in ED Medications - No data to display  ED Course/ Medical Decision Making/ A&P                           Medical Decision Making Patient complains of a cough and congestion he is worried that he has pneumonia  Amount and/or Complexity of Data Reviewed Labs: ordered. Decision-making details documented in ED Course.    Details: Labs  ordered reviewed and interpreted patient has a positive influenza test Radiology: ordered and independent interpretation performed. Decision-making details documented in ED Course.    Details: Chest x-ray shows no evidence of pneumonia  Risk Risk Details: Patient is counseled on influenza he is advised his testing is positive he does not have pneumonia on his chest x-ray patient is counseled on symptomatic treatment he is advised to follow-up with his physician for recheck if symptoms worsen or change           Final Clinical Impression(s) / ED Diagnoses Final diagnoses:  Influenza A    Rx / DC Orders ED Discharge Orders     None     An After Visit Summary was printed and given to the patient.     [name] 03/03/22 2123    Jonathan Pence, MD 03/03/22 2306

## 2023-05-04 ENCOUNTER — Other Ambulatory Visit: Payer: Self-pay

## 2023-05-04 ENCOUNTER — Emergency Department (HOSPITAL_COMMUNITY): Payer: Medicare Other

## 2023-05-04 ENCOUNTER — Emergency Department (HOSPITAL_COMMUNITY)
Admission: EM | Admit: 2023-05-04 | Discharge: 2023-05-04 | Disposition: A | Discharge status: Home / Self Care | Payer: Medicare Other | Attending: Student | Admitting: Student

## 2023-05-04 ENCOUNTER — Encounter (HOSPITAL_COMMUNITY): Payer: Self-pay

## 2023-05-04 DIAGNOSIS — R112 Nausea with vomiting, unspecified: Secondary | ICD-10-CM | POA: Diagnosis present

## 2023-05-04 DIAGNOSIS — J168 Pneumonia due to other specified infectious organisms: Secondary | ICD-10-CM | POA: Diagnosis not present

## 2023-05-04 DIAGNOSIS — J181 Lobar pneumonia, unspecified organism: Secondary | ICD-10-CM | POA: Insufficient documentation

## 2023-05-04 DIAGNOSIS — J189 Pneumonia, unspecified organism: Secondary | ICD-10-CM

## 2023-05-04 LAB — COMPREHENSIVE METABOLIC PANEL
ALT: 21 U/L (ref 0–44)
AST: 18 U/L (ref 15–41)
Albumin: 3.7 g/dL (ref 3.5–5.0)
Alkaline Phosphatase: 47 U/L (ref 38–126)
Anion gap: 9 (ref 5–15)
BUN: 11 mg/dL (ref 8–23)
CO2: 22 mmol/L (ref 22–32)
Calcium: 8.8 mg/dL — ABNORMAL LOW (ref 8.9–10.3)
Chloride: 106 mmol/L (ref 98–111)
Creatinine, Ser: 1.05 mg/dL (ref 0.61–1.24)
GFR, Estimated: >60 mL/min (ref >60)
Glucose, Bld: 98 mg/dL (ref 70–99)
Potassium: 3.9 mmol/L (ref 3.5–5.1)
Sodium: 137 mmol/L (ref 135–145)
Total Bilirubin: 0.8 mg/dL (ref 0.0–1.2)
Total Protein: 7 g/dL (ref 6.5–8.1)

## 2023-05-04 LAB — RESP PANEL BY RT-PCR (RSV, FLU A&B, COVID)  RVPGX2
Influenza A by PCR: NEGATIVE
Influenza B by PCR: NEGATIVE
Resp Syncytial Virus by PCR: NEGATIVE
SARS Coronavirus 2 by RT PCR: NEGATIVE

## 2023-05-04 LAB — CBC WITH DIFFERENTIAL/PLATELET
Abs Immature Granulocytes: 0.03 10*3/uL (ref 0.00–0.07)
Basophils Absolute: 0 10*3/uL (ref 0.0–0.1)
Basophils Relative: 0 %
Eosinophils Absolute: 0.2 10*3/uL (ref 0.0–0.5)
Eosinophils Relative: 2 %
HCT: 46.8 % (ref 39.0–52.0)
Hemoglobin: 15.3 g/dL (ref 13.0–17.0)
Immature Granulocytes: 0 %
Lymphocytes Relative: 11 %
Lymphs Abs: 1.2 10*3/uL (ref 0.7–4.0)
MCH: 30.8 pg (ref 26.0–34.0)
MCHC: 32.7 g/dL (ref 30.0–36.0)
MCV: 94.2 fL (ref 80.0–100.0)
Monocytes Absolute: 1 10*3/uL (ref 0.1–1.0)
Monocytes Relative: 9 %
Neutro Abs: 8.9 10*3/uL — ABNORMAL HIGH (ref 1.7–7.7)
Neutrophils Relative %: 78 %
Platelets: 203 10*3/uL (ref 150–400)
RBC: 4.97 MIL/uL (ref 4.22–5.81)
RDW: 13.1 % (ref 11.5–15.5)
WBC: 11.4 10*3/uL — ABNORMAL HIGH (ref 4.0–10.5)
nRBC: 0 % (ref 0.0–0.2)

## 2023-05-04 LAB — LIPASE, BLOOD: Lipase: 26 U/L (ref 11–51)

## 2023-05-04 MED ORDER — BENZONATATE 100 MG PO CAPS: 100.0000 mg | ORAL_CAPSULE | Freq: Three times a day (TID) | ORAL | 0 refills | Status: AC

## 2023-05-04 MED ORDER — SODIUM CHLORIDE 0.9 % IV BOLUS
1000.0000 mL | Freq: Once | INTRAVENOUS | Status: AC
  Administered 2023-05-04: 1000 mL via INTRAVENOUS

## 2023-05-04 MED ORDER — ONDANSETRON HCL 4 MG/2ML IJ SOLN
4.0000 mg | Freq: Once | INTRAMUSCULAR | Status: AC
  Administered 2023-05-04: 4 mg via INTRAVENOUS
  Filled 2023-05-04: qty 2

## 2023-05-04 MED ORDER — AMOXICILLIN-POT CLAVULANATE 875-125 MG PO TABS
1.0000 | ORAL_TABLET | Freq: Once | ORAL | Status: AC
  Administered 2023-05-04: 1 via ORAL
  Filled 2023-05-04: qty 1

## 2023-05-04 MED ORDER — ONDANSETRON 4 MG PO TBDP: 4.0000 mg | ORAL_TABLET | Freq: Three times a day (TID) | ORAL | 0 refills | Status: AC | PRN

## 2023-05-04 MED ORDER — IOHEXOL 300 MG/ML  SOLN
100.0000 mL | Freq: Once | INTRAMUSCULAR | Status: AC | PRN
  Administered 2023-05-04: 100 mL via INTRAVENOUS

## 2023-05-04 MED ORDER — AMOXICILLIN-POT CLAVULANATE 875-125 MG PO TABS: 1.0000 | ORAL_TABLET | Freq: Two times a day (BID) | ORAL | 0 refills | Status: AC

## 2023-05-04 NOTE — ED Triage Notes (Signed)
 Pt arrived via POV from home c/o generalized abdominal pain, emesis, and reports OTC medication has not helped. Pt reports pain radiates to his back. Pt reports Hx of diverticulitis.

## 2023-05-04 NOTE — ED Provider Notes (Signed)
 [place] [facility]GENC[facility] DEPARTMENT AT [facility] Provider Note   CSN: [id number] Arrival date & time: 05/04/23  1417     History  Chief Complaint  Patient presents with   Abdominal Pain    [name] is a 68 y.o. male.  Patient is a 68 year old male who presents to the emergency department with a chief complaint of lower abdominal pain, nausea, vomiting and diarrhea.  Patient notes his symptoms began approximately 2 days ago.  He has had associated fevers at home.  He denies any associated melena, hematochezia.  He has had no associated chest pain or shortness of breath.  He notes that he does have a history of diverticulitis.   Abdominal Pain      Home Medications Prior to Admission medications   Medication Sig Start Date End Date Taking? Authorizing Provider  amLODipine (NORVASC) 10 MG tablet Take 10 mg by mouth daily. 04/19/18   [provider]  benzonatate (TESSALON) 100 MG capsule Take 1 capsule (100 mg total) by mouth every 8 (eight) hours. 05/17/21   Teressa Lower, PA-C  diphenhydrAMINE (BENADRYL) 25 MG tablet Take 25 mg by mouth every 6 (six) hours as needed.    [provider]  ibuprofen (ADVIL,MOTRIN) 200 MG tablet Take 400 mg by mouth every 6 (six) hours as needed for mild pain or moderate pain.     [provider]  olmesartan (BENICAR) 5 MG tablet Take 5 mg by mouth daily. 10/10/17   [provider]  polyethylene glycol-electrolytes (TRILYTE) 420 g solution Take 4,000 mLs by mouth as directed. Patient not taking: Reported on 05/17/2021 07/20/18   Rourk, Gerrit Friends, MD      Allergies    Ciprofloxacin    Review of Systems   Review of Systems  Gastrointestinal:  Positive for abdominal pain.  All other systems reviewed and are negative.   Physical Exam Updated Vital Signs BP (!) 152/86 (BP Location: Right Arm)   Pulse 65   Temp 99 F (37.2 C) (Temporal)   Resp 18   Ht 5\' 7"  (1.702 m)   Wt 84 kg   SpO2 95%    BMI 29.00 kg/m  Physical Exam Vitals reviewed.  Constitutional:      Appearance: Normal appearance.  HENT:     Head: Normocephalic and atraumatic.     Nose: Nose normal.     Mouth/Throat:     Mouth: Mucous membranes are moist.  Eyes:     Extraocular Movements: Extraocular movements intact.     Conjunctiva/sclera: Conjunctivae normal.     Pupils: Pupils are equal, round, and reactive to light.  Cardiovascular:     Rate and Rhythm: Normal rate and regular rhythm.     Pulses: Normal pulses.     Heart sounds: Normal heart sounds.  Pulmonary:     Effort: Pulmonary effort is normal. No respiratory distress.     Breath sounds: Normal breath sounds. No stridor. No wheezing.  Abdominal:     General: Abdomen is flat. Bowel sounds are normal.     Palpations: Abdomen is soft.     Tenderness: There is no guarding or rebound.     Comments: Tenderness palpation to lower abdomen  Musculoskeletal:        General: Normal range of motion.     Cervical back: Normal range of motion and neck supple.  Skin:    General: Skin is warm and dry.     Findings: No rash.  Neurological:  General: No focal deficit present.     Mental Status: He is alert and oriented to person, place, and time. Mental status is at baseline.  Psychiatric:        Mood and Affect: Mood normal.        Behavior: Behavior normal.        Thought Content: Thought content normal.        Judgment: Judgment normal.     ED Results / Procedures / Treatments   Labs (all labs ordered are listed, but only abnormal results are displayed) Labs Reviewed  RESP PANEL BY RT-PCR (RSV, FLU A&B, COVID)  [id number]  LIPASE, BLOOD  COMPREHENSIVE METABOLIC PANEL  URINALYSIS, ROUTINE W REFLEX MICROSCOPIC  CBC WITH DIFFERENTIAL/PLATELET    EKG None  Radiology No results found.  Procedures Procedures    Medications Ordered in ED Medications  sodium chloride 0.9 % bolus 1,000 mL (has no administration in time range)  ondansetron  (ZOFRAN) injection 4 mg (has no administration in time range)    ED Course/ Medical Decision Making/ A&P                                 Medical Decision Making Amount and/or Complexity of Data Reviewed Labs: ordered. Radiology: ordered.  Risk Prescription drug management.   This patient presents to the ED for concern of abdominal pain, nausea, vomiting, diarrhea, cough and congestion differential diagnosis includes acute viral syndrome, pneumonia, acute appendicitis, cholecystitis, testicular torsion, diverticulitis, kidney stone    Additional history obtained:  Additional history obtained from medical records External records from outside source obtained and reviewed including none   Lab Tests:  I Ordered, and personally interpreted labs.  The pertinent results include: Mild leukocytosis, negative viral swabs, stable metabolic panel   Imaging Studies ordered:  I ordered imaging studies including CT scan of the abdomen and pelvis I independently visualized and interpreted imaging which showed right sided lower lobe consolidation, no inflammatory changes within the abdomen and pelvis I agree with the radiologist interpretation   Medicines ordered and prescription drug management:  I ordered medication including Augmentin for pneumonia Reevaluation of the patient after these medicines showed that the patient improved I have reviewed the patients home medicines and have made adjustments as needed   Problem List / ED Course:  Patient is doing well at this time and is stable for discharge home.  Discussed with patient that CT scan of the abdomen pelvis was concerning for possible right lower lobe pneumonia.  Patient does note that he has had an associated cough over the past few days.  Viral swabs were negative.  Blood work has been overall unremarkable and he is otherwise low risk for his curb 65 score.  Patient has no acute surgical pathology noted on CT scan of the  abdomen and pelvis.  Will treat with Augmentin at this time provide coverage for pneumonia as well as for possible early diverticulitis.  Vital signs are stable at this point with no indication for sepsis and no associated hypoxia.  Close follow-up with primary care doctor was discussed as well as strict return precautions for any new or worsening symptoms.  Patient voiced understanding had no additional questions.   Social Determinants of Health:  None           Final Clinical Impression(s) / ED Diagnoses Final diagnoses:  None    Rx / DC Orders ED Discharge Orders  None         Kathlen Mody 05/04/23 1913    Glendora Score, MD 05/05/23 [phone]

## 2023-05-04 NOTE — Discharge Instructions (Addendum)
 Please take all antibiotics as directed.  Return to the emergency department immediately for any new or worsening symptoms.  Please follow-up closely with your primary care doctor on an outpatient basis to ensure resolution of symptoms.

## 2023-06-28 ENCOUNTER — Encounter: Payer: Self-pay | Admitting: *Deleted
